# Patient Record
Sex: Female | Born: 1965 | Hispanic: No | Smoking: Never smoker
Health system: Southern US, Community
[De-identification: ages and names within clinical notes are randomized; demographics above are authoritative.]

## PROBLEM LIST (undated history)

## (undated) DIAGNOSIS — F419 Anxiety disorder, unspecified: Secondary | ICD-10-CM

## (undated) DIAGNOSIS — M1712 Unilateral primary osteoarthritis, left knee: Secondary | ICD-10-CM

## (undated) DIAGNOSIS — S83209A Unspecified tear of unspecified meniscus, current injury, unspecified knee, initial encounter: Secondary | ICD-10-CM

## (undated) DIAGNOSIS — G43909 Migraine, unspecified, not intractable, without status migrainosus: Secondary | ICD-10-CM

## (undated) HISTORY — DX: Unilateral primary osteoarthritis, left knee: M17.12

## (undated) HISTORY — DX: Unspecified tear of unspecified meniscus, current injury, unspecified knee, initial encounter: S83.209A

## (undated) HISTORY — DX: Migraine, unspecified, not intractable, without status migrainosus: G43.909

## (undated) HISTORY — DX: Anxiety disorder, unspecified: F41.9

---

## 1998-05-01 ENCOUNTER — Ambulatory Visit (HOSPITAL_COMMUNITY): Admission: RE | Admit: 1998-05-01 | Discharge: 1998-05-01 | Payer: Self-pay | Admitting: Obstetrics and Gynecology

## 1998-07-26 ENCOUNTER — Inpatient Hospital Stay (HOSPITAL_COMMUNITY): Admission: AD | Admit: 1998-07-26 | Discharge: 1998-07-28 | Payer: Self-pay | Admitting: Obstetrics and Gynecology

## 1998-08-02 ENCOUNTER — Encounter (HOSPITAL_COMMUNITY): Admission: RE | Admit: 1998-08-02 | Discharge: 1998-10-31 | Payer: Self-pay | Admitting: Obstetrics and Gynecology

## 1999-04-30 ENCOUNTER — Other Ambulatory Visit: Admission: RE | Admit: 1999-04-30 | Discharge: 1999-04-30 | Payer: Self-pay | Admitting: Obstetrics and Gynecology

## 1999-09-09 ENCOUNTER — Ambulatory Visit (HOSPITAL_COMMUNITY): Admission: RE | Admit: 1999-09-09 | Discharge: 1999-09-09 | Payer: Self-pay | Admitting: Obstetrics and Gynecology

## 1999-11-28 ENCOUNTER — Inpatient Hospital Stay (HOSPITAL_COMMUNITY): Admission: AD | Admit: 1999-11-28 | Discharge: 1999-11-30 | Payer: Self-pay | Admitting: Obstetrics and Gynecology

## 2002-05-11 ENCOUNTER — Other Ambulatory Visit: Admission: RE | Admit: 2002-05-11 | Discharge: 2002-05-11 | Payer: Self-pay | Admitting: Obstetrics and Gynecology

## 2003-10-25 ENCOUNTER — Other Ambulatory Visit: Admission: RE | Admit: 2003-10-25 | Discharge: 2003-10-25 | Payer: Self-pay | Admitting: Obstetrics and Gynecology

## 2004-02-08 ENCOUNTER — Other Ambulatory Visit: Admission: RE | Admit: 2004-02-08 | Discharge: 2004-02-08 | Payer: Self-pay | Admitting: Obstetrics and Gynecology

## 2004-12-19 ENCOUNTER — Other Ambulatory Visit: Admission: RE | Admit: 2004-12-19 | Discharge: 2004-12-19 | Payer: Self-pay | Admitting: Obstetrics and Gynecology

## 2006-03-02 HISTORY — PX: URETHRA SURGERY: SHX824

## 2009-03-02 HISTORY — PX: MYOMECTOMY: SHX85

## 2009-07-24 ENCOUNTER — Emergency Department (HOSPITAL_BASED_OUTPATIENT_CLINIC_OR_DEPARTMENT_OTHER): Admission: EM | Admit: 2009-07-24 | Discharge: 2009-07-24 | Payer: Self-pay | Admitting: Emergency Medicine

## 2009-07-24 ENCOUNTER — Ambulatory Visit: Payer: Self-pay | Admitting: Diagnostic Radiology

## 2009-09-24 ENCOUNTER — Ambulatory Visit: Payer: Self-pay | Admitting: Radiology

## 2009-09-24 ENCOUNTER — Emergency Department (HOSPITAL_BASED_OUTPATIENT_CLINIC_OR_DEPARTMENT_OTHER): Admission: EM | Admit: 2009-09-24 | Discharge: 2009-09-24 | Payer: Self-pay | Admitting: Emergency Medicine

## 2010-01-31 ENCOUNTER — Ambulatory Visit (HOSPITAL_COMMUNITY)
Admission: RE | Admit: 2010-01-31 | Discharge: 2010-01-31 | Payer: Self-pay | Source: Home / Self Care | Admitting: Obstetrics and Gynecology

## 2010-05-12 LAB — CBC
HCT: 38 % (ref 36.0–46.0)
MCH: 30.5 pg (ref 26.0–34.0)
MCHC: 35.1 g/dL (ref 30.0–36.0)
RBC: 4.36 MIL/uL (ref 3.87–5.11)
WBC: 5.2 10*3/uL (ref 4.0–10.5)

## 2010-11-07 ENCOUNTER — Encounter: Payer: Self-pay | Admitting: Emergency Medicine

## 2010-11-07 ENCOUNTER — Inpatient Hospital Stay (INDEPENDENT_AMBULATORY_CARE_PROVIDER_SITE_OTHER)
Admission: RE | Admit: 2010-11-07 | Discharge: 2010-11-07 | Disposition: A | Discharge status: Home / Self Care | Payer: BC Managed Care – PPO | Source: Ambulatory Visit | Attending: Emergency Medicine | Admitting: Emergency Medicine

## 2010-11-07 DIAGNOSIS — S61409A Unspecified open wound of unspecified hand, initial encounter: Secondary | ICD-10-CM | POA: Insufficient documentation

## 2010-11-07 DIAGNOSIS — Z23 Encounter for immunization: Secondary | ICD-10-CM

## 2010-11-09 ENCOUNTER — Inpatient Hospital Stay (INDEPENDENT_AMBULATORY_CARE_PROVIDER_SITE_OTHER)
Admission: RE | Admit: 2010-11-09 | Discharge: 2010-11-09 | Disposition: A | Discharge status: Home / Self Care | Payer: BC Managed Care – PPO | Source: Ambulatory Visit | Attending: Emergency Medicine | Admitting: Emergency Medicine

## 2010-11-09 ENCOUNTER — Encounter: Payer: Self-pay | Admitting: Emergency Medicine

## 2010-11-09 DIAGNOSIS — S61409A Unspecified open wound of unspecified hand, initial encounter: Secondary | ICD-10-CM

## 2011-02-02 NOTE — Progress Notes (Signed)
Summary: stitches hurting/TM   Vital Signs:  Patient Profile:   45 Years Old Female CC:      stitches hurt O2 treatment:    Room Air (left arm) Cuff size:   regular  Vitals Entered By: Linton Flemings RN (November 09, 2010 11:07 AM)                  Updated Prior Medication List: CELEXA 20 MG TABS (CITALOPRAM HYDROBROMIDE)   Current Allergies (reviewed today): No known allergies History of Present Illness Chief Complaint: stitches hurt History of Present Illness: She was here 2 days ago for stitches in her thumb.  She is c/o pain from the stitches.  She has not tried any Aleve or ice.  No F/C, increased redness, pus.    REVIEW OF SYSTEMS Constitutional Symptoms      Denies fever, chills, night sweats, weight loss, weight gain, and fatigue.  Eyes       Denies change in vision, eye pain, eye discharge, glasses, contact lenses, and eye surgery. Ear/Nose/Throat/Mouth       Denies hearing loss/aids, change in hearing, ear pain, ear discharge, dizziness, frequent runny nose, frequent nose bleeds, sinus problems, sore throat, hoarseness, and tooth pain or bleeding.  Respiratory       Denies dry cough, productive cough, wheezing, shortness of breath, asthma, bronchitis, and emphysema/COPD.  Cardiovascular       Denies murmurs, chest pain, and tires easily with exhertion.    Gastrointestinal       Denies stomach pain, nausea/vomiting, diarrhea, constipation, blood in bowel movements, and indigestion. Genitourniary       Denies painful urination, kidney stones, and loss of urinary control. Neurological       Denies paralysis, seizures, and fainting/blackouts. Musculoskeletal       Denies muscle pain, joint pain, joint stiffness, decreased range of motion, redness, swelling, muscle weakness, and gout.  Skin       Denies bruising, unusual mles/lumps or sores, and hair/skin or nail changes.  Psych       Denies mood changes, temper/anger issues, anxiety/stress, speech problems,  depression, and sleep problems.  Past History:  Past Medical History: Reviewed history from 11/07/2010 and no changes required. Unremarkable  Past Surgical History: Reviewed history from 11/07/2010 and no changes required. Denies surgical history  Family History: Reviewed history and no changes required.  Social History: Reviewed history and no changes required.  Plan New Orders: No Charge Patient Arrived (NCPA0) [NCPA0] Planning Comments:   She is evaluated by the nurse who believes it doesn't look infected and it is still a clean wound.  It is rewrapped and she is advised to use dry cool compresses, Aleve as needed.  It will likely be sore for weeks, but if worsening, needs to f/u with her PCP. *Of note, she is being very dramatic and won't let the nurse take her BP because the cuff "hurts too much".  This is c/w when I saw the patient 2 days ago.  ER precautions given for severe pain or difficulty moving thumb.   The patient and/or caregiver has been counseled thoroughly with regard to medications prescribed including dosage, schedule, interactions, rationale for use, and possible side effects and they verbalize understanding.  Diagnoses and expected course of recovery discussed and will return if not improved as expected or if the condition worsens. Patient and/or caregiver verbalized understanding.   PROCEDURE: No redness. No edema. No drainage from incision.  Orders Added: 1)  No Charge Patient  Arrived (NCPA0) [NCPA0]

## 2011-02-02 NOTE — Progress Notes (Signed)
Summary: LAC TO THUMB...WSE   Vital Signs:  Patient Profile:   45 Years Old Female CC:      Lac to thumb Height:     67 inches Weight:      245 pounds O2 Sat:      99 % O2 treatment:    Room Air Temp:     98.4 degrees F oral Pulse rate:   86 / minute Pulse rhythm:   regular Resp:     18 per minute BP sitting:   132 / 84  (left arm) Cuff size:   large  Vitals Entered By: Emilio Math (November 07, 2010 8:14 PM)                  Current Allergies: No known allergies History of Present Illness Chief Complaint: Lac to thumb History of Present Illness: R handed cut her L thumb on a barbed wire fence today.  It bled a lot initially but stopped with pressure.  She has tenderness currently.  Last Td about 5 years ago.  No F/C.  No problems using her hand or fingers.  REVIEW OF SYSTEMS Constitutional Symptoms      Denies fever, chills, night sweats, weight loss, weight gain, and fatigue.  Eyes       Denies change in vision, eye pain, eye discharge, glasses, contact lenses, and eye surgery. Ear/Nose/Throat/Mouth       Denies hearing loss/aids, change in hearing, ear pain, ear discharge, dizziness, frequent runny nose, frequent nose bleeds, sinus problems, sore throat, hoarseness, and tooth pain or bleeding.  Respiratory       Denies dry cough, productive cough, wheezing, shortness of breath, asthma, bronchitis, and emphysema/COPD.  Cardiovascular       Denies murmurs, chest pain, and tires easily with exhertion.    Gastrointestinal       Denies stomach pain, nausea/vomiting, diarrhea, constipation, blood in bowel movements, and indigestion. Genitourniary       Denies painful urination, kidney stones, and loss of urinary control. Neurological       Denies paralysis, seizures, and fainting/blackouts. Musculoskeletal       Denies muscle pain, joint pain, joint stiffness, decreased range of motion, redness, swelling, muscle weakness, and gout.  Skin       Denies bruising,  unusual mles/lumps or sores, and hair/skin or nail changes.      Comments: Lac to thumb Psych       Denies mood changes, temper/anger issues, anxiety/stress, speech problems, depression, and sleep problems.  Past History:  Past Medical History: Unremarkable  Past Surgical History: Denies surgical history Physical Exam General appearance: well developed, well nourished, no acute distress MSE: oriented to time, place, and person L hand: there is a 1.5cm superficial laceration, minimal oozing of blood. She has TTP.  It is located radial aspect along 1st metacarpal.  Distal NV status intact.  FROM IP, MCP, [facility].   Assessment New Problems: WOUND, OPEN, HAND, WITHOUT COMPLICATIONS (ICD-882.0)   Patient Education: Patient and/or caregiver instructed in the following: rest, fluids, Tylenol prn, Ibuprofen prn.  Plan New Orders: Tdap => 72yrs IM [90715] Admin 1st Vaccine [90471] New Patient Level III [99203] Repair Superficial Wound(s) 2.5cm or <(scalp,neck,axillae,ext gent,trunk,extre) [12001] Planning Comments:   Wound repaired as follows.  Wound precautions given.  No antibiotics given today since appears very clean, but warned pt and told to call if worsening at all.  Td given today since was a potentially rusty fence.  Keep clean and dry.  RTC 9-10 days for removal.   The patient and/or caregiver has been counseled thoroughly with regard to medications prescribed including dosage, schedule, interactions, rationale for use, and possible side effects and they verbalize understanding.  Diagnoses and expected course of recovery discussed and will return if not improved as expected or if the condition worsens. Patient and/or caregiver verbalized understanding.   PROCEDURE:  Suture Site: L hand radial aspect of R thumb metacarpal Size: 1.5cm Number of Lacerations: 1 Procedure: Risks, benefits and alternatives discussed with patient.  They voice understanding. Discussed benefits and risks  of procedure and verbal consent obtained.  Using sterile technique and local 1% lidocaine without epinephrine, cleansed wound with hibaclens followed by copious lavage with normal saline.  Wound carefully inspected for debris and foreign bodies; none found.  Wound closed with #2 , 4-0 interrupted nylon sutures.  Bacitracin and non-stick sterile dressing applied.  Wound precautions explained to patient.   Orders Added: 1)  Tdap => 51yrs IM [90715] 2)  Admin 1st Vaccine [90471] 3)  New Patient Level III [16109] 4)  Repair Superficial Wound(s) 2.5cm or <(scalp,neck,axillae,ext gent,trunk,extre) [12001]   Immunizations Administered:  Tetanus Vaccine:    Vaccine Type: Tdap    Site: left deltoid    Mfr: Boostrix    Dose: 0.5 ml    Route: IM    Given by: Emilio Math    Exp. Date: 01/24/2012    Lot #: [id number]    VIS given: 01/18/08 version given November 07, 2010.   Immunizations Administered:  Tetanus Vaccine:    Vaccine Type: Tdap    Site: left deltoid    Mfr: Boostrix    Dose: 0.5 ml    Route: IM    Given by: Emilio Math    Exp. Date: 01/24/2012    Lot #: [id number]    VIS given: 01/18/08 version given November 07, 2010.   Orders Added: 1)  Tdap => 4yrs IM [90715] 2)  Admin 1st Vaccine [90471] 3)  New Patient Level III [13086] 4)  Repair Superficial Wound(s) 2.5cm or <(scalp,neck,axillae,ext gent,trunk,extre) [12001]

## 2011-04-03 DIAGNOSIS — S83209A Unspecified tear of unspecified meniscus, current injury, unspecified knee, initial encounter: Secondary | ICD-10-CM

## 2011-04-03 HISTORY — DX: Unspecified tear of unspecified meniscus, current injury, unspecified knee, initial encounter: S83.209A

## 2011-07-21 IMAGING — CR DG SHOULDER 2+V*L*
3 series · 3 of 3 positions shown · non-contrast
Comparison: None.

CLINICAL DATA: Fell - left shoulder pain

LEFT SHOULDER - 2+ VIEW

[w shoulder ap internal left]
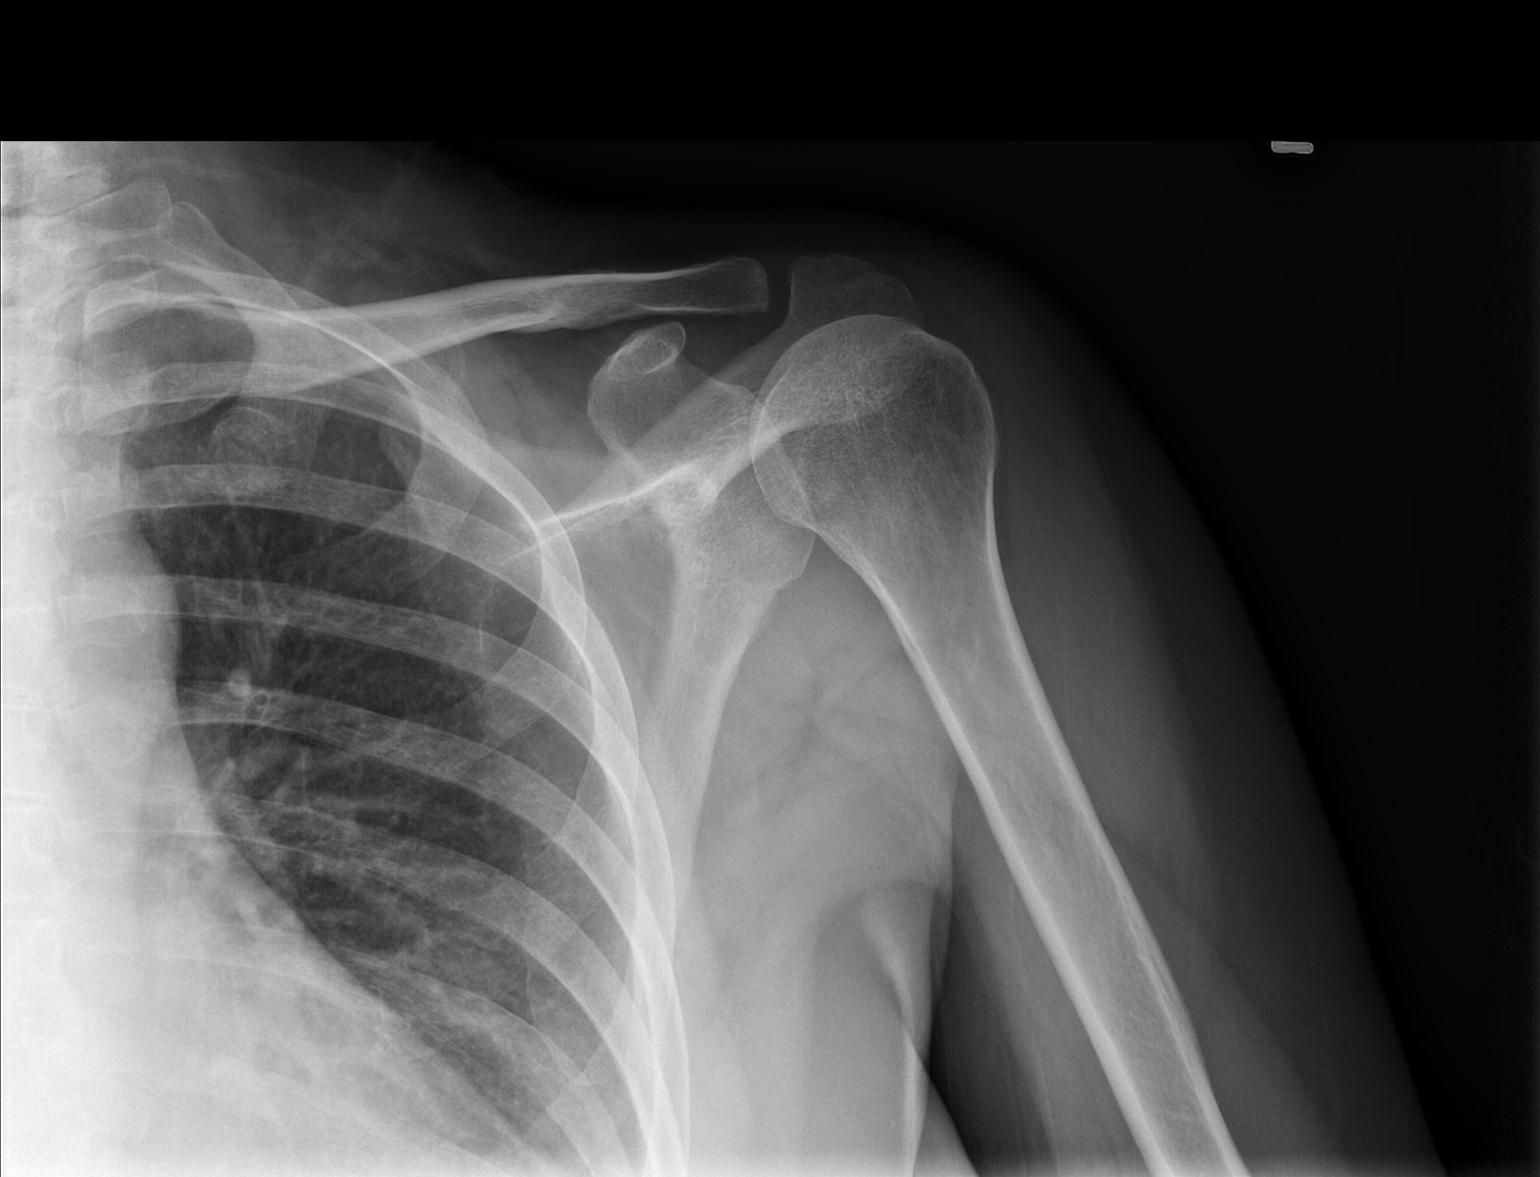

[w shoulder ap external left]
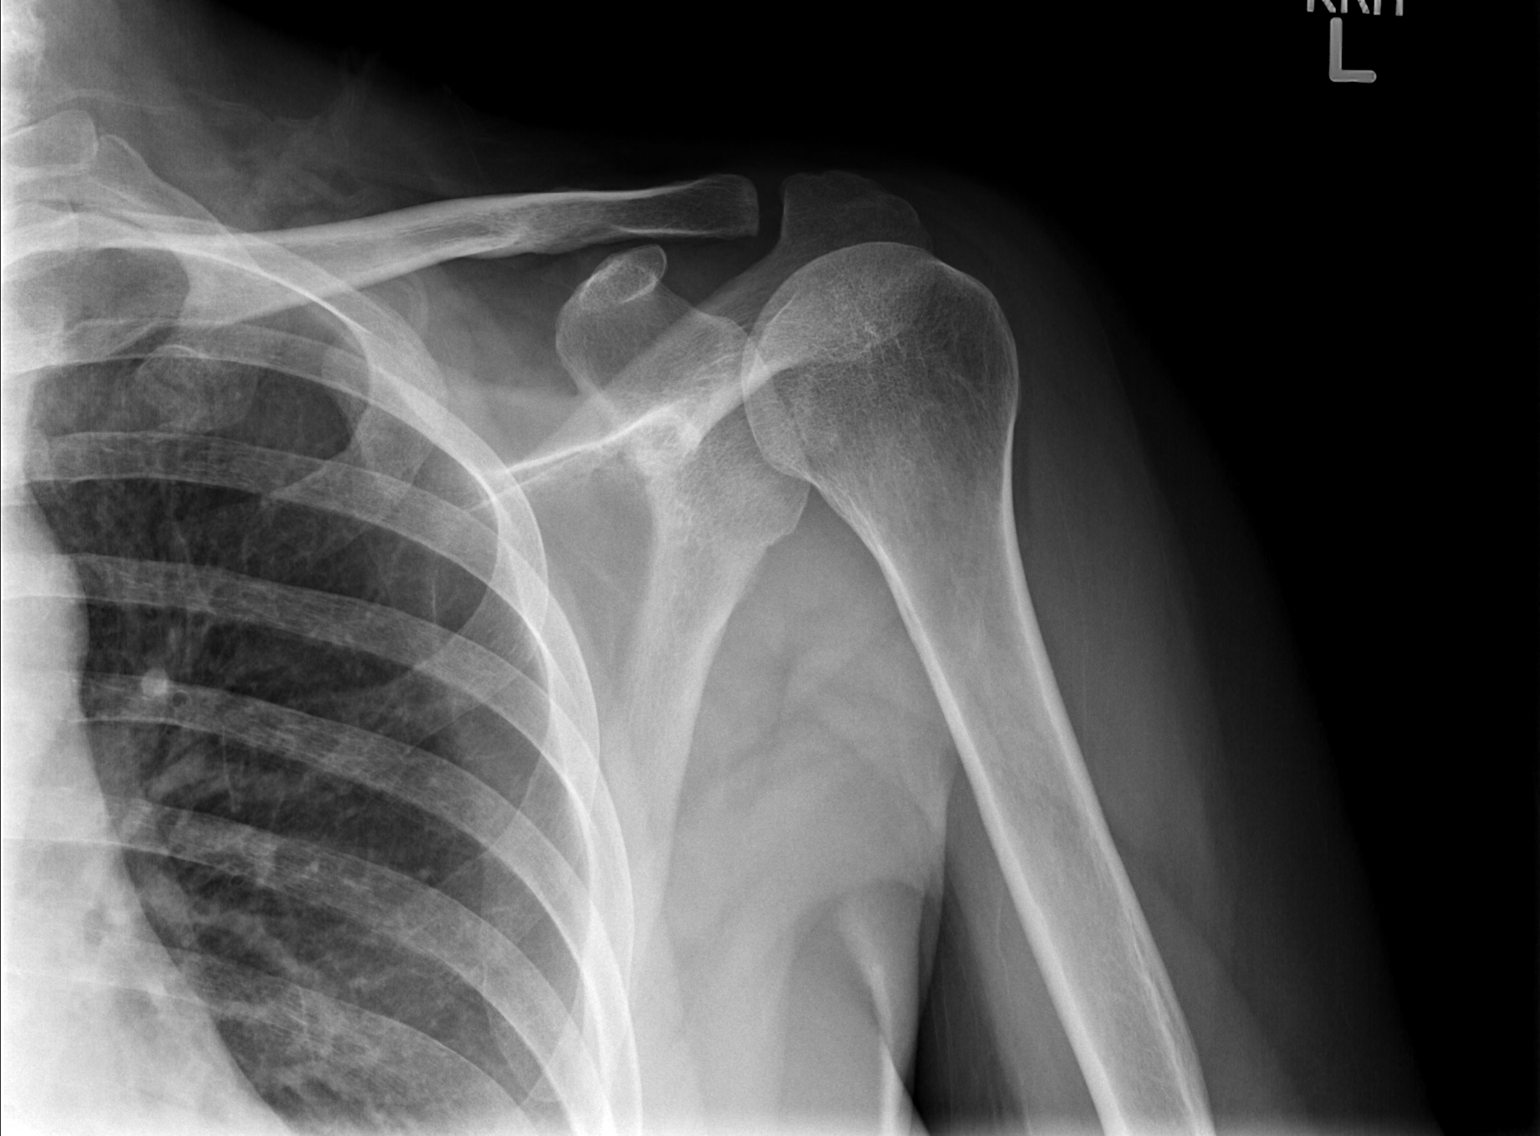

[w shoulder y view left]
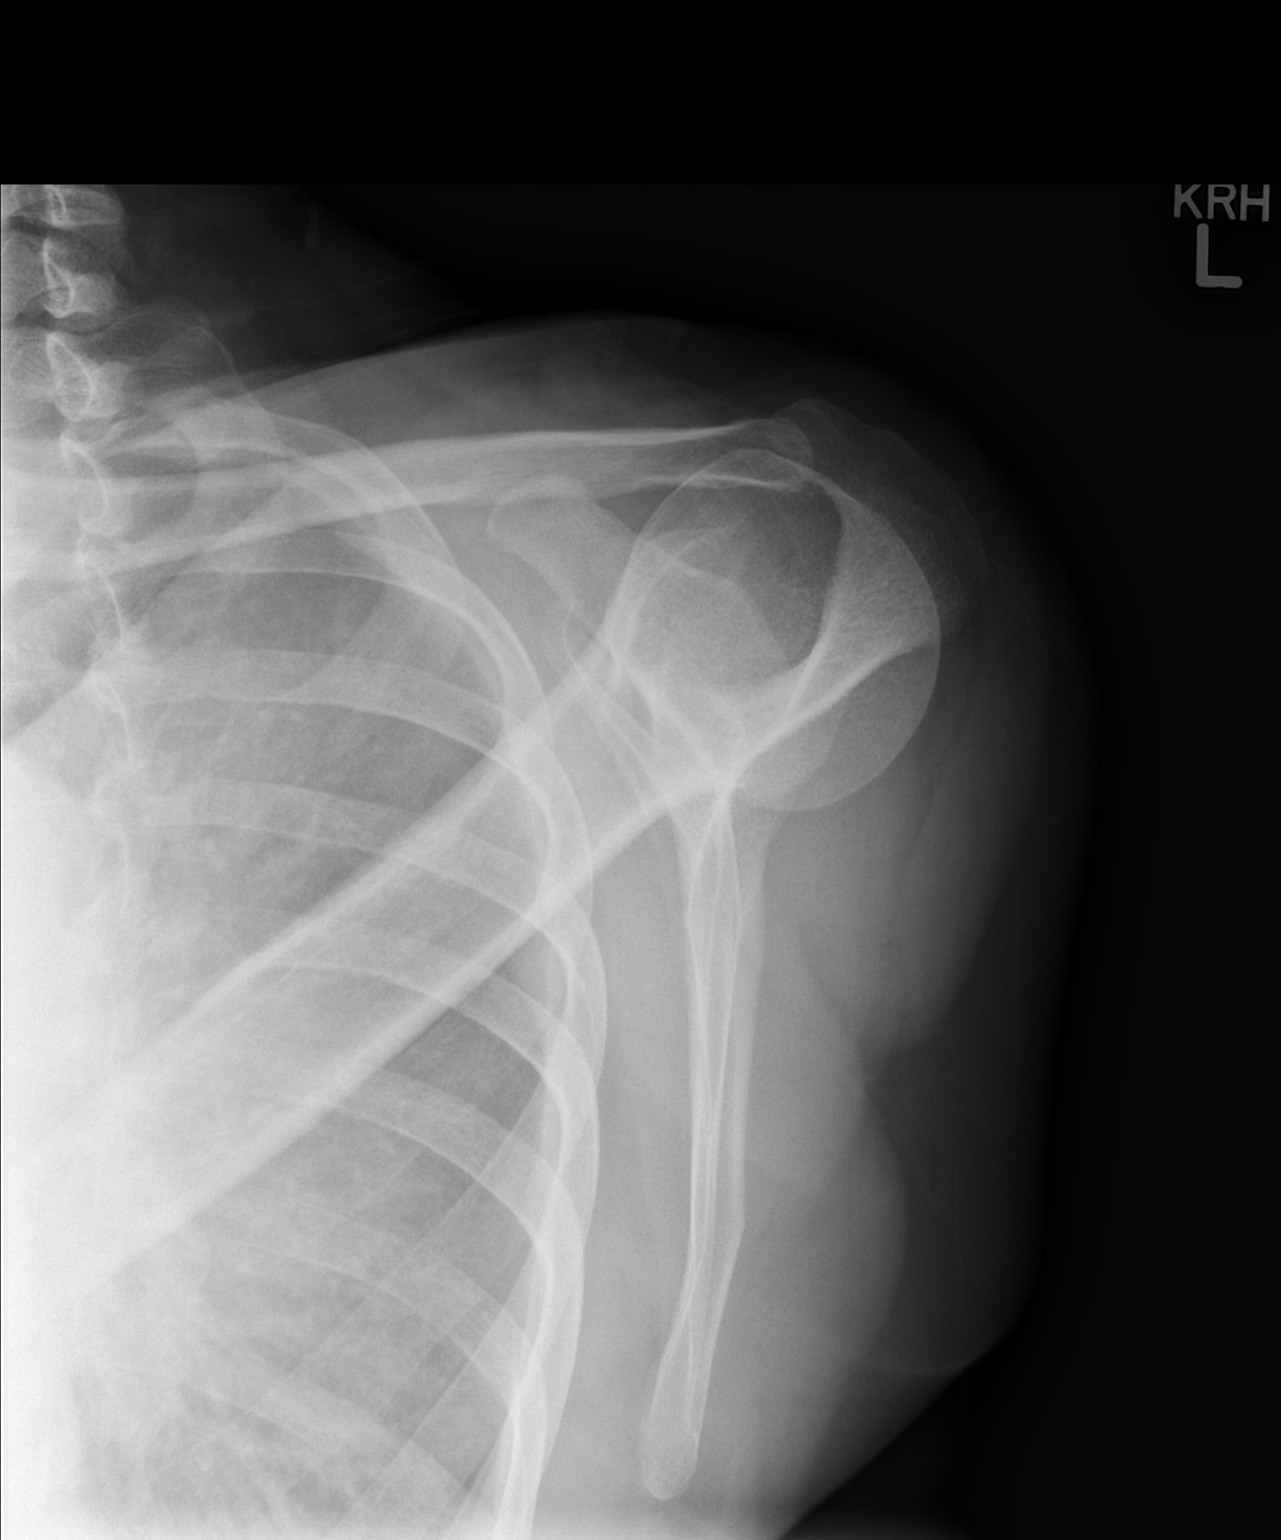

[3 of 3 positions shown; findings below may reference images not displayed]

FINDINGS: No fracture or dislocation.  No foreign body or other
abnormality of the soft tissues.
IMPRESSION: No acute or significant findings.

## 2011-08-13 HISTORY — PX: MENISCUS REPAIR: SHX5179

## 2011-08-31 ENCOUNTER — Encounter (HOSPITAL_COMMUNITY): Payer: Self-pay | Admitting: Psychiatry

## 2011-08-31 ENCOUNTER — Ambulatory Visit (INDEPENDENT_AMBULATORY_CARE_PROVIDER_SITE_OTHER): Payer: BC Managed Care – PPO | Admitting: Psychiatry

## 2011-08-31 VITALS — BP 130/86 | HR 92 | Ht 67.0 in | Wt 252.0 lb

## 2011-08-31 DIAGNOSIS — F419 Anxiety disorder, unspecified: Secondary | ICD-10-CM

## 2011-08-31 DIAGNOSIS — F411 Generalized anxiety disorder: Secondary | ICD-10-CM

## 2011-08-31 DIAGNOSIS — F329 Major depressive disorder, single episode, unspecified: Secondary | ICD-10-CM

## 2011-08-31 MED ORDER — CITALOPRAM HYDROBROMIDE 20 MG PO TABS: 20.0000 mg | ORAL_TABLET | Freq: Once | ORAL | Status: DC

## 2011-08-31 MED ORDER — VENLAFAXINE HCL ER 37.5 MG PO CP24: 37.5000 mg | ORAL_CAPSULE | Freq: Every day | ORAL | Status: DC
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
## 2011-09-19 DIAGNOSIS — F419 Anxiety disorder, unspecified: Secondary | ICD-10-CM | POA: Insufficient documentation

## 2011-09-19 DIAGNOSIS — F330 Major depressive disorder, recurrent, mild: Secondary | ICD-10-CM | POA: Insufficient documentation

## 2011-09-28 ENCOUNTER — Ambulatory Visit (INDEPENDENT_AMBULATORY_CARE_PROVIDER_SITE_OTHER): Payer: BC Managed Care – PPO | Admitting: Psychiatry

## 2011-09-28 ENCOUNTER — Encounter (HOSPITAL_COMMUNITY): Payer: Self-pay | Admitting: Psychiatry

## 2011-09-28 VITALS — BP 124/85 | HR 100 | Ht 67.0 in | Wt 254.0 lb

## 2011-09-28 DIAGNOSIS — F332 Major depressive disorder, recurrent severe without psychotic features: Secondary | ICD-10-CM

## 2011-09-28 DIAGNOSIS — F411 Generalized anxiety disorder: Secondary | ICD-10-CM

## 2011-09-28 MED ORDER — VENLAFAXINE HCL ER 150 MG PO CP24: 150.0000 mg | ORAL_CAPSULE | Freq: Every day | ORAL | Status: DC
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
## 2011-10-14 ENCOUNTER — Ambulatory Visit (HOSPITAL_COMMUNITY): Payer: Self-pay | Admitting: Psychiatry

## 2011-11-06 ENCOUNTER — Ambulatory Visit (HOSPITAL_COMMUNITY): Payer: Self-pay | Admitting: Psychiatry

## 2011-11-11 ENCOUNTER — Encounter (HOSPITAL_COMMUNITY): Payer: Self-pay | Admitting: Psychiatry

## 2011-11-11 ENCOUNTER — Ambulatory Visit (INDEPENDENT_AMBULATORY_CARE_PROVIDER_SITE_OTHER): Payer: BC Managed Care – PPO | Admitting: Psychiatry

## 2011-11-11 VITALS — BP 132/94 | Ht 67.0 in | Wt 254.0 lb

## 2011-11-11 DIAGNOSIS — F411 Generalized anxiety disorder: Secondary | ICD-10-CM

## 2011-11-11 DIAGNOSIS — F329 Major depressive disorder, single episode, unspecified: Secondary | ICD-10-CM

## 2011-11-11 DIAGNOSIS — F419 Anxiety disorder, unspecified: Secondary | ICD-10-CM

## 2011-11-11 DIAGNOSIS — F332 Major depressive disorder, recurrent severe without psychotic features: Secondary | ICD-10-CM
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
## 2011-11-13 ENCOUNTER — Encounter (HOSPITAL_COMMUNITY): Payer: Self-pay | Admitting: Psychiatry

## 2011-11-17 ENCOUNTER — Telehealth (HOSPITAL_COMMUNITY): Payer: Self-pay

## 2011-11-17 DIAGNOSIS — F411 Generalized anxiety disorder: Secondary | ICD-10-CM

## 2011-11-17 MED ORDER — VENLAFAXINE HCL ER 37.5 MG PO CP24
ORAL_CAPSULE | ORAL | Status: DC
Start: 2011-11-17 — End: 2011-12-14

## 2011-11-17 NOTE — Telephone Encounter (Signed)
Will attempt to titrate the patient off Effexor by 37.5 mg daily. If there is a return of depressive symptoms or anxiety, will initiate a trial of Zoloft as Zoloft was started in 2008 for anxiety while the patient was in a stressful marriage.   PLAN: Will start titrating dosage of Effexor 37.5 mg-Take 3 capsules for 7 days, then 2 capsules for 7 days, then 1 capsule for 7 days, then 1 capsule every other day for 7 days, then stop. I have advised the patient to call the clinic every Monday, prior to decreasing dosage, or if she has a return of depressive symptoms or anxiety at which point we will initiate sertraline and possibly bupropion for augmentation.

## 2011-11-17 NOTE — Telephone Encounter (Signed)
Pt is still having high blood pressure. Went to pcp and pcp is concerned that effexor is causing it. Would like to see if you could wean of effexor and try something else.

## 2011-11-17 NOTE — Telephone Encounter (Signed)
Patient called. She reports that she went to her PCP who believes effexor may be the cause of hypertension.  As patient has done well on Zoloft and Wellbutrin in the past, will taper effexor and start a titrating dose of zoloft, and later add wellbutrin if needed.

## 2011-11-17 NOTE — Addendum Note (Signed)
Addended by: Larena Sox on: 11/17/2011 04:42 PM   Modules accepted: Orders

## 2011-11-20 ENCOUNTER — Other Ambulatory Visit (HOSPITAL_COMMUNITY): Payer: Self-pay | Admitting: Psychiatry

## 2011-12-14 ENCOUNTER — Ambulatory Visit (INDEPENDENT_AMBULATORY_CARE_PROVIDER_SITE_OTHER): Payer: BC Managed Care – PPO | Admitting: Psychiatry

## 2011-12-14 ENCOUNTER — Encounter (HOSPITAL_COMMUNITY): Payer: Self-pay | Admitting: Psychiatry

## 2011-12-14 VITALS — BP 125/96 | HR 103 | Ht 67.0 in | Wt 261.0 lb

## 2011-12-14 DIAGNOSIS — F332 Major depressive disorder, recurrent severe without psychotic features: Secondary | ICD-10-CM

## 2011-12-14 DIAGNOSIS — F411 Generalized anxiety disorder: Secondary | ICD-10-CM

## 2011-12-14 DIAGNOSIS — F419 Anxiety disorder, unspecified: Secondary | ICD-10-CM

## 2011-12-14 MED ORDER — BUSPIRONE HCL 5 MG PO TABS: 5.0000 mg | ORAL_TABLET | Freq: Three times a day (TID) | ORAL | Status: DC
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
## 2012-01-05 ENCOUNTER — Ambulatory Visit (INDEPENDENT_AMBULATORY_CARE_PROVIDER_SITE_OTHER): Payer: BC Managed Care – PPO | Admitting: Psychiatry

## 2012-01-05 ENCOUNTER — Encounter (HOSPITAL_COMMUNITY): Payer: Self-pay | Admitting: Psychiatry

## 2012-01-05 VITALS — BP 132/95 | HR 88 | Ht 67.0 in | Wt 262.0 lb

## 2012-01-05 DIAGNOSIS — F332 Major depressive disorder, recurrent severe without psychotic features: Secondary | ICD-10-CM

## 2012-01-05 DIAGNOSIS — F329 Major depressive disorder, single episode, unspecified: Secondary | ICD-10-CM

## 2012-01-05 DIAGNOSIS — F411 Generalized anxiety disorder: Secondary | ICD-10-CM
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
## 2012-01-11 ENCOUNTER — Other Ambulatory Visit (HOSPITAL_COMMUNITY): Payer: Self-pay | Admitting: Psychiatry

## 2012-01-14 ENCOUNTER — Emergency Department
Admission: EM | Admit: 2012-01-14 | Discharge: 2012-01-31 | Disposition: E | Payer: BC Managed Care – PPO | Source: Home / Self Care

## 2012-01-14 ENCOUNTER — Telehealth (HOSPITAL_COMMUNITY): Payer: Self-pay | Admitting: Psychiatry

## 2012-01-14 NOTE — Telephone Encounter (Signed)
The patient reports she is doing well on buspirone 5 mg BID.  PLAN: Advised patient to continue this dosage.

## 2012-01-31 DEATH — deceased

## 2012-02-10 ENCOUNTER — Encounter (HOSPITAL_COMMUNITY): Payer: Self-pay | Admitting: Psychiatry

## 2012-02-10 ENCOUNTER — Ambulatory Visit (INDEPENDENT_AMBULATORY_CARE_PROVIDER_SITE_OTHER): Payer: BC Managed Care – PPO | Admitting: Psychiatry

## 2012-02-10 VITALS — BP 136/87 | HR 89 | Ht 67.0 in | Wt 260.0 lb

## 2012-02-10 DIAGNOSIS — F411 Generalized anxiety disorder: Secondary | ICD-10-CM

## 2012-02-10 DIAGNOSIS — F330 Major depressive disorder, recurrent, mild: Secondary | ICD-10-CM

## 2012-02-10 DIAGNOSIS — F419 Anxiety disorder, unspecified: Secondary | ICD-10-CM

## 2012-02-10 MED ORDER — BUSPIRONE HCL 5 MG PO TABS: 5.0000 mg | ORAL_TABLET | Freq: Three times a day (TID) | ORAL | Status: DC
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
## 2012-04-12 ENCOUNTER — Ambulatory Visit (HOSPITAL_COMMUNITY): Payer: Self-pay | Admitting: Psychiatry

## 2012-04-22 ENCOUNTER — Ambulatory Visit (INDEPENDENT_AMBULATORY_CARE_PROVIDER_SITE_OTHER): Payer: BC Managed Care – PPO | Admitting: Psychiatry

## 2012-04-22 ENCOUNTER — Encounter (HOSPITAL_COMMUNITY): Payer: Self-pay | Admitting: Psychiatry

## 2012-04-22 VITALS — BP 119/82 | HR 90 | Ht 67.0 in | Wt 253.0 lb

## 2012-04-22 DIAGNOSIS — F419 Anxiety disorder, unspecified: Secondary | ICD-10-CM

## 2012-04-22 DIAGNOSIS — F330 Major depressive disorder, recurrent, mild: Secondary | ICD-10-CM

## 2012-04-22 DIAGNOSIS — F411 Generalized anxiety disorder: Secondary | ICD-10-CM

## 2012-04-22 MED ORDER — BUSPIRONE HCL 5 MG PO TABS: 5.0000 mg | ORAL_TABLET | Freq: Two times a day (BID) | ORAL | Status: DC
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
## 2012-06-03 ENCOUNTER — Ambulatory Visit (INDEPENDENT_AMBULATORY_CARE_PROVIDER_SITE_OTHER): Payer: BC Managed Care – PPO | Admitting: Psychiatry

## 2012-06-03 ENCOUNTER — Encounter (HOSPITAL_COMMUNITY): Payer: Self-pay | Admitting: Psychiatry

## 2012-06-03 VITALS — BP 123/90 | HR 76 | Ht 67.0 in | Wt 248.0 lb

## 2012-06-03 DIAGNOSIS — F330 Major depressive disorder, recurrent, mild: Secondary | ICD-10-CM

## 2012-06-03 DIAGNOSIS — F419 Anxiety disorder, unspecified: Secondary | ICD-10-CM

## 2012-06-03 DIAGNOSIS — F411 Generalized anxiety disorder: Secondary | ICD-10-CM

## 2012-06-03 MED ORDER — BUSPIRONE HCL 10 MG PO TABS: 10.0000 mg | ORAL_TABLET | Freq: Three times a day (TID) | ORAL | Status: DC

## 2012-06-03 MED ORDER — SERTRALINE HCL 50 MG PO TABS: 50.0000 mg | ORAL_TABLET | Freq: Every day | ORAL | Status: DC

## 2012-06-03 MED ORDER — TRAZODONE HCL 50 MG PO TABS: 50.0000 mg | ORAL_TABLET | Freq: Every day | ORAL | Status: DC
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
## 2012-06-21 ENCOUNTER — Ambulatory Visit (HOSPITAL_COMMUNITY): Payer: Self-pay | Admitting: Psychiatry

## 2012-07-04 ENCOUNTER — Other Ambulatory Visit (HOSPITAL_COMMUNITY): Payer: Self-pay

## 2012-07-07 ENCOUNTER — Encounter: Payer: Self-pay | Admitting: Physician Assistant

## 2012-07-07 ENCOUNTER — Other Ambulatory Visit: Payer: Self-pay | Admitting: Physician Assistant

## 2012-07-07 DIAGNOSIS — G43909 Migraine, unspecified, not intractable, without status migrainosus: Secondary | ICD-10-CM | POA: Insufficient documentation

## 2012-07-07 DIAGNOSIS — M1712 Unilateral primary osteoarthritis, left knee: Secondary | ICD-10-CM | POA: Insufficient documentation

## 2012-07-07 NOTE — H&P (Signed)
TOTAL KNEE ADMISSION H&P  Patient is being admitted for left total knee arthroplasty.  Subjective:  Chief Complaint:left knee pain.  HPI: Melissa Blake, 47 y.o. female, has a history of pain and functional disability in the left knee due to trauma and arthritis and has failed non-surgical conservative treatments for greater than 12 weeks to includeNSAID's and/or analgesics, corticosteriod injections, flexibility and strengthening excercises, supervised PT with diminished ADL's post treatment, use of assistive devices, weight reduction as appropriate and activity modification.  Onset of symptoms was gradual, starting 4 years ago with rapidlly worsening course since that time. The patient noted prior procedures on the knee to include  arthroscopy and menisectomy on the left knee(s).  Patient currently rates pain in the left knee(s) at 10 out of 10 with activity. Patient has night pain, worsening of pain with activity and weight bearing, pain that interferes with activities of daily living, crepitus and joint swelling.  Patient has evidence of subchondral sclerosis, periarticular osteophytes and joint space narrowing by imaging studies.  There is no active infection.  Patient Active Problem List   Diagnosis Date Noted  . MDD (major depressive disorder), recurrent episode, mild 09/19/2011  . Anxiety disorder 09/19/2011  . WOUND, OPEN, HAND, WITHOUT COMPLICATIONS 11/07/2010   Past Medical History  Diagnosis Date  . Meniscus tear 04/2011  . Migraines   . Degenerative arthritis of left knee   . Anxiety     Past Surgical History  Procedure Laterality Date  . Meniscus repair  08-13-11  . Urethra surgery  2008  . Myomectomy  2011     (Not in a hospital admission) Allergies  Allergen Reactions  . Diflucan (Fluconazole) Hives    History  Substance Use Topics  . Smoking status: Never Smoker   . Smokeless tobacco: Not on file  . Alcohol Use: 1.0 oz/week    2 drink(s) per week     Comment:  1-2 beers a month    Family History  Problem Relation Age of Onset  . Hypertension Father   . Asthma Father   . Nephrolithiasis Father   . Depression Maternal Aunt   . Diabetes type II Maternal Grandmother      Review of Systems  Constitutional: Negative.   HENT: Negative.   Eyes: Negative.   Respiratory: Negative.   Cardiovascular: Negative.   Gastrointestinal: Negative.   Genitourinary: Negative.   Musculoskeletal: Positive for joint pain.       Left knee  Skin: Positive for rash.  Neurological: Negative.   Psychiatric/Behavioral: Negative.     Objective:  Physical Exam  Constitutional: She is oriented to person, place, and time. She appears well-developed and well-nourished.  HENT:  Head: Normocephalic and atraumatic.  Eyes: Conjunctivae and EOM are normal. Pupils are equal, round, and reactive to light.  Neck: Normal range of motion.  Cardiovascular: Normal rate and regular rhythm.   Respiratory: Effort normal.  GI: Soft.  Genitourinary:  Not pertinent to current symptomatology therefore not examined.  Musculoskeletal: She exhibits tenderness.   Examination of her left knee reveals pain medially and laterally 1+ crepitation 1+ synovitis range of motion 0-120 degrees moderate varus deformity knee is stable to ligamentous exam with normal patella tracking. Exam of her right knee reveals full range of motion without pain swelling weakness or instability. Vascular exam: pulses 2+ and symmetric.  Neurological: She is alert and oriented to person, place, and time.  Skin: Skin is warm and dry.  Psychiatric: She has a normal mood and   affect. Her behavior is normal. Thought content normal.    Vital signs in last 24 hours: Last recorded: 05/08 1500   BP: 122/86 Pulse: 79  Temp: 98.1 F (36.7 C)    Height: 5' 7" (1.702 m) SpO2: 98  Weight: 109.77 kg (242 lb)     Labs:   Estimated body mass index is 37.89 kg/(m^2) as calculated from the following:   Height as of  this encounter: 5' 7" (1.702 m).   Weight as of this encounter: 109.77 kg (242 lb).   Imaging Review Plain radiographs demonstrate severe degenerative joint disease of the left knee(s). The overall alignment issignificant varus. The bone quality appears to be good for age and reported activity level.  Assessment/Plan:  End stage arthritis, left knee   The patient history, physical examination, clinical judgment of the provider and imaging studies are consistent with end stage degenerative joint disease of the left knee(s) and total knee arthroplasty is deemed medically necessary. The treatment options including medical management, injection therapy arthroscopy and arthroplasty were discussed at length. The risks and benefits of total knee arthroplasty were presented and reviewed. The risks due to aseptic loosening, infection, stiffness, patella tracking problems, thromboembolic complications and other imponderables were discussed. The patient acknowledged the explanation, agreed to proceed with the plan and consent was signed. Patient is being admitted for inpatient treatment for surgery, pain control, PT, OT, prophylactic antibiotics, VTE prophylaxis, progressive ambulation and ADL's and discharge planning. The patient is planning to be discharged to skilled nursing facility vs going home  Lawrie Tunks A. Yue Flanigan, PA-C Physician Assistant [facility] [phone]  07/07/2012, 5:05 PM   

## 2012-07-09 NOTE — Pre-Procedure Instructions (Signed)
Melissa Blake  07/09/2012   Your procedure is scheduled on:  May 19  Report to [facility] at 07:00 AM.  Call this number if you have problems the morning of surgery: [phone]   Remember:   Do not eat food or drink liquids after midnight.   Take these medicines the morning of surgery with A SIP OF WATER: Topamax, Zoloft, Buspar, Hydrocodone (if needed),      STOP Nabumetone (Relafen) Today  Do not wear jewelry, make-up or nail polish.  Do not wear lotions, powders, or perfumes. You may wear deodorant.  Do not shave 48 hours prior to surgery. Men may shave face and neck.  Do not bring valuables to the hospital.  Contacts, dentures or bridgework may not be worn into surgery.  Leave suitcase in the car. After surgery it may be brought to your room.  For patients admitted to the hospital, checkout time is 11:00 AM the day of discharge.   Special Instructions: Shower using CHG 2 nights before surgery and the night before surgery.  If you shower the day of surgery use CHG.  Use special wash - you have one bottle of CHG for all showers.  You should use approximately 1/3 of the bottle for each shower.   Please read over the following fact sheets that you were given: Pain Booklet, Coughing and Deep Breathing, Blood Transfusion Information, MRSA Information and Surgical Site Infection Prevention

## 2012-07-11 ENCOUNTER — Encounter (HOSPITAL_COMMUNITY): Payer: Self-pay

## 2012-07-11 ENCOUNTER — Encounter (HOSPITAL_COMMUNITY)
Admission: RE | Admit: 2012-07-11 | Discharge: 2012-07-11 | Disposition: A | Discharge status: Home / Self Care | Payer: BC Managed Care – PPO | Source: Ambulatory Visit | Attending: Physician Assistant | Admitting: Physician Assistant

## 2012-07-11 ENCOUNTER — Encounter (HOSPITAL_COMMUNITY)
Admission: RE | Admit: 2012-07-11 | Discharge: 2012-07-11 | Disposition: A | Discharge status: Home / Self Care | Payer: BC Managed Care – PPO | Source: Ambulatory Visit | Attending: Orthopedic Surgery | Admitting: Orthopedic Surgery

## 2012-07-11 DIAGNOSIS — Z01810 Encounter for preprocedural cardiovascular examination: Secondary | ICD-10-CM | POA: Insufficient documentation

## 2012-07-11 DIAGNOSIS — Z01818 Encounter for other preprocedural examination: Secondary | ICD-10-CM | POA: Insufficient documentation

## 2012-07-11 DIAGNOSIS — Z01812 Encounter for preprocedural laboratory examination: Secondary | ICD-10-CM | POA: Insufficient documentation

## 2012-07-11 LAB — CBC WITH DIFFERENTIAL/PLATELET
Basophils Absolute: 0 10*3/uL (ref 0.0–0.1)
HCT: 38.6 % (ref 36.0–46.0)
Hemoglobin: 13.9 g/dL (ref 12.0–15.0)
Lymphocytes Relative: 25 % (ref 12–46)
Monocytes Absolute: 0.3 10*3/uL (ref 0.1–1.0)
Monocytes Relative: 5 % (ref 3–12)
Neutro Abs: 4.1 10*3/uL (ref 1.7–7.7)
Neutrophils Relative %: 68 % (ref 43–77)
WBC: 6.1 10*3/uL (ref 4.0–10.5)

## 2012-07-11 LAB — SURGICAL PCR SCREEN: Staphylococcus aureus: NEGATIVE

## 2012-07-11 LAB — URINALYSIS, ROUTINE W REFLEX MICROSCOPIC
Glucose, UA: NEGATIVE mg/dL
Ketones, ur: NEGATIVE mg/dL
Nitrite: NEGATIVE
Specific Gravity, Urine: 1.024 (ref 1.005–1.030)
pH: 7 (ref 5.0–8.0)

## 2012-07-11 LAB — COMPREHENSIVE METABOLIC PANEL
AST: 24 U/L (ref 0–37)
Albumin: 3.5 g/dL (ref 3.5–5.2)
Alkaline Phosphatase: 144 U/L — ABNORMAL HIGH (ref 39–117)
CO2: 22 mEq/L (ref 19–32)
Chloride: 105 mEq/L (ref 96–112)
Creatinine, Ser: 0.75 mg/dL (ref 0.50–1.10)
GFR calc non Af Amer: >90 mL/min (ref >90)
Potassium: 3.7 mEq/L (ref 3.5–5.1)
Total Bilirubin: 0.3 mg/dL (ref 0.3–1.2)

## 2012-07-11 LAB — PROTIME-INR: INR: 0.99 (ref 0.00–1.49)

## 2012-07-11 LAB — HCG, SERUM, QUALITATIVE: Preg, Serum: NEGATIVE

## 2012-07-11 LAB — TYPE AND SCREEN

## 2012-07-11 LAB — URINE MICROSCOPIC-ADD ON

## 2012-07-11 LAB — APTT: aPTT: 30 seconds (ref 24–37)

## 2012-07-11 LAB — ABO/RH: ABO/RH(D): O NEG

## 2012-07-12 LAB — URINE CULTURE: Colony Count: 60000

## 2012-07-17 MED ORDER — SODIUM CHLORIDE 0.9 % IV SOLN
1000.0000 mg | INTRAVENOUS | Status: AC
  Administered 2012-07-18: 1000 mg via INTRAVENOUS
  Filled 2012-07-17 (×2): qty 10

## 2012-07-17 MED ORDER — CEFAZOLIN SODIUM-DEXTROSE 2-3 GM-% IV SOLR
2.0000 g | INTRAVENOUS | Status: AC
  Administered 2012-07-18: 2 g via INTRAVENOUS

## 2012-07-18 ENCOUNTER — Encounter (HOSPITAL_COMMUNITY)
Admission: RE | Disposition: A | Discharge status: Home / Self Care | Payer: Self-pay | Source: Ambulatory Visit | Attending: Orthopedic Surgery

## 2012-07-18 ENCOUNTER — Encounter (HOSPITAL_COMMUNITY): Payer: Self-pay | Admitting: Anesthesiology

## 2012-07-18 ENCOUNTER — Encounter (HOSPITAL_COMMUNITY): Payer: Self-pay | Admitting: *Deleted

## 2012-07-18 ENCOUNTER — Ambulatory Visit (HOSPITAL_COMMUNITY): Payer: BC Managed Care – PPO | Admitting: Anesthesiology

## 2012-07-18 ENCOUNTER — Inpatient Hospital Stay (HOSPITAL_COMMUNITY)
Admission: RE | Admit: 2012-07-18 | Discharge: 2012-07-19 | DRG: 209 | Disposition: A | Discharge status: Home / Self Care | Payer: BC Managed Care – PPO | Source: Ambulatory Visit | Attending: Orthopedic Surgery | Admitting: Orthopedic Surgery

## 2012-07-18 DIAGNOSIS — F419 Anxiety disorder, unspecified: Secondary | ICD-10-CM | POA: Diagnosis present

## 2012-07-18 DIAGNOSIS — F411 Generalized anxiety disorder: Secondary | ICD-10-CM | POA: Diagnosis present

## 2012-07-18 DIAGNOSIS — F330 Major depressive disorder, recurrent, mild: Secondary | ICD-10-CM | POA: Diagnosis present

## 2012-07-18 DIAGNOSIS — M1712 Unilateral primary osteoarthritis, left knee: Secondary | ICD-10-CM | POA: Diagnosis present

## 2012-07-18 DIAGNOSIS — M1710 Unilateral primary osteoarthritis, unspecified knee: Principal | ICD-10-CM | POA: Diagnosis present

## 2012-07-18 DIAGNOSIS — G43909 Migraine, unspecified, not intractable, without status migrainosus: Secondary | ICD-10-CM | POA: Diagnosis present

## 2012-07-18 DIAGNOSIS — Z01810 Encounter for preprocedural cardiovascular examination: Secondary | ICD-10-CM

## 2012-07-18 DIAGNOSIS — Z01812 Encounter for preprocedural laboratory examination: Secondary | ICD-10-CM

## 2012-07-18 DIAGNOSIS — Z01818 Encounter for other preprocedural examination: Secondary | ICD-10-CM

## 2012-07-18 DIAGNOSIS — Z79899 Other long term (current) drug therapy: Secondary | ICD-10-CM

## 2012-07-18 HISTORY — PX: TOTAL KNEE ARTHROPLASTY: SHX125

## 2012-07-18 LAB — CBC
HCT: 34.5 % — ABNORMAL LOW (ref 36.0–46.0)
Hemoglobin: 12 g/dL (ref 12.0–15.0)
MCH: 30.4 pg (ref 26.0–34.0)
MCV: 87.3 fL (ref 78.0–100.0)
RBC: 3.95 MIL/uL (ref 3.87–5.11)
WBC: 11.2 10*3/uL — ABNORMAL HIGH (ref 4.0–10.5)

## 2012-07-18 LAB — C-REACTIVE PROTEIN: CRP: 0.7 mg/dL — ABNORMAL HIGH (ref <0.60)

## 2012-07-18 LAB — CREATININE, SERUM: GFR calc Af Amer: >90 mL/min (ref >90)

## 2012-07-18 SURGERY — ARTHROPLASTY, KNEE, TOTAL
Anesthesia: General | Site: Knee | Laterality: Left | Wound class: Clean

## 2012-07-18 MED ORDER — GLYCOPYRROLATE 0.2 MG/ML IJ SOLN
INTRAMUSCULAR | Status: DC | PRN
  Administered 2012-07-18: 0.6 mg via INTRAVENOUS

## 2012-07-18 MED ORDER — OXYCODONE HCL ER 10 MG PO T12A
40.0000 mg | EXTENDED_RELEASE_TABLET | Freq: Two times a day (BID) | ORAL | Status: DC
  Administered 2012-07-18 – 2012-07-19 (×2): 40 mg via ORAL
  Filled 2012-07-18 (×2): qty 4

## 2012-07-18 MED ORDER — MENTHOL 3 MG MT LOZG: 1.0000 | LOZENGE | OROMUCOSAL | Status: DC | PRN

## 2012-07-18 MED ORDER — EPINEPHRINE HCL 0.1 MG/ML IJ SOSY
PREFILLED_SYRINGE | INTRAMUSCULAR | Status: DC | PRN
  Administered 2012-07-18: 5 ug via SUBCUTANEOUS

## 2012-07-18 MED ORDER — LACTATED RINGERS IV SOLN: INTRAVENOUS | Status: DC

## 2012-07-18 MED ORDER — ZOLPIDEM TARTRATE 5 MG PO TABS: 5.0000 mg | ORAL_TABLET | Freq: Every evening | ORAL | Status: DC | PRN

## 2012-07-18 MED ORDER — ONDANSETRON HCL 4 MG/2ML IJ SOLN: 4.0000 mg | Freq: Four times a day (QID) | INTRAMUSCULAR | Status: DC | PRN

## 2012-07-18 MED ORDER — SODIUM CHLORIDE 0.9 % IR SOLN
Status: DC | PRN
  Administered 2012-07-18: 1000 mL

## 2012-07-18 MED ORDER — DIAZEPAM 5 MG PO TABS
ORAL_TABLET | ORAL | Status: AC
  Administered 2012-07-18: 5 mg via ORAL
  Filled 2012-07-18: qty 1

## 2012-07-18 MED ORDER — DEXAMETHASONE 6 MG PO TABS
10.0000 mg | ORAL_TABLET | Freq: Three times a day (TID) | ORAL | Status: AC
  Administered 2012-07-18 – 2012-07-19 (×3): 10 mg via ORAL
  Filled 2012-07-18 (×3): qty 1

## 2012-07-18 MED ORDER — CELECOXIB 200 MG PO CAPS
200.0000 mg | ORAL_CAPSULE | Freq: Two times a day (BID) | ORAL | Status: DC
  Administered 2012-07-18 – 2012-07-19 (×2): 200 mg via ORAL
  Filled 2012-07-18 (×4): qty 1

## 2012-07-18 MED ORDER — MIDAZOLAM HCL 2 MG/2ML IJ SOLN
INTRAMUSCULAR | Status: AC
  Administered 2012-07-18: 0.5 mg via INTRAVENOUS
  Filled 2012-07-18: qty 2

## 2012-07-18 MED ORDER — TOPIRAMATE 25 MG PO TABS
25.0000 mg | ORAL_TABLET | Freq: Two times a day (BID) | ORAL | Status: DC
  Administered 2012-07-18 – 2012-07-19 (×2): 25 mg via ORAL
  Filled 2012-07-18 (×3): qty 1

## 2012-07-18 MED ORDER — HYDROMORPHONE HCL PF 1 MG/ML IJ SOLN
INTRAMUSCULAR | Status: AC
  Filled 2012-07-18: qty 1

## 2012-07-18 MED ORDER — ENOXAPARIN SODIUM 30 MG/0.3ML ~~LOC~~ SOLN
30.0000 mg | Freq: Two times a day (BID) | SUBCUTANEOUS | Status: DC
  Administered 2012-07-19: 30 mg via SUBCUTANEOUS
  Filled 2012-07-18 (×3): qty 0.3

## 2012-07-18 MED ORDER — CHLORHEXIDINE GLUCONATE 4 % EX LIQD: 60.0000 mL | Freq: Once | CUTANEOUS | Status: DC

## 2012-07-18 MED ORDER — CEFUROXIME SODIUM 1.5 G IJ SOLR
INTRAMUSCULAR | Status: DC | PRN
  Administered 2012-07-18: 1.5 g

## 2012-07-18 MED ORDER — DEXAMETHASONE SODIUM PHOSPHATE 10 MG/ML IJ SOLN
INTRAMUSCULAR | Status: DC | PRN
  Administered 2012-07-18: 8 mg via INTRAVENOUS

## 2012-07-18 MED ORDER — BUPIVACAINE-EPINEPHRINE PF 0.25-1:200000 % IJ SOLN
INTRAMUSCULAR | Status: AC
  Filled 2012-07-18: qty 30

## 2012-07-18 MED ORDER — ROPIVACAINE HCL 5 MG/ML IJ SOLN
INTRAMUSCULAR | Status: DC | PRN
  Administered 2012-07-18: 30 mL via EPIDURAL

## 2012-07-18 MED ORDER — BUPIVACAINE-EPINEPHRINE 0.25% -1:200000 IJ SOLN
INTRAMUSCULAR | Status: DC | PRN
  Administered 2012-07-18: 30 mL

## 2012-07-18 MED ORDER — SODIUM CHLORIDE 0.9 % IR SOLN
Status: DC | PRN
  Administered 2012-07-18: 3000 mL

## 2012-07-18 MED ORDER — BISACODYL 5 MG PO TBEC
10.0000 mg | DELAYED_RELEASE_TABLET | Freq: Every day | ORAL | Status: DC
  Administered 2012-07-18: 10 mg via ORAL
  Filled 2012-07-18: qty 2

## 2012-07-18 MED ORDER — ACETAMINOPHEN 10 MG/ML IV SOLN
1000.0000 mg | Freq: Four times a day (QID) | INTRAVENOUS | Status: DC
  Administered 2012-07-18 – 2012-07-19 (×3): 1000 mg via INTRAVENOUS
  Filled 2012-07-18 (×5): qty 100

## 2012-07-18 MED ORDER — POVIDONE-IODINE 7.5 % EX SOLN: Freq: Once | CUTANEOUS | Status: DC

## 2012-07-18 MED ORDER — ONDANSETRON HCL 4 MG/2ML IJ SOLN
INTRAMUSCULAR | Status: DC | PRN
  Administered 2012-07-18 (×2): 4 mg via INTRAVENOUS

## 2012-07-18 MED ORDER — ONDANSETRON HCL 4 MG PO TABS: 4.0000 mg | ORAL_TABLET | Freq: Four times a day (QID) | ORAL | Status: DC | PRN

## 2012-07-18 MED ORDER — FENTANYL CITRATE 0.05 MG/ML IJ SOLN
INTRAMUSCULAR | Status: AC
  Administered 2012-07-18: 100 ug via INTRAVENOUS
  Filled 2012-07-18: qty 2

## 2012-07-18 MED ORDER — ARTIFICIAL TEARS OP OINT
TOPICAL_OINTMENT | OPHTHALMIC | Status: DC | PRN
  Administered 2012-07-18: 1 via OPHTHALMIC

## 2012-07-18 MED ORDER — HYDROMORPHONE HCL PF 1 MG/ML IJ SOLN: 1.0000 mg | INTRAMUSCULAR | Status: DC | PRN

## 2012-07-18 MED ORDER — ACETAMINOPHEN 10 MG/ML IV SOLN
1000.0000 mg | Freq: Once | INTRAVENOUS | Status: AC
  Administered 2012-07-18: 1000 mg via INTRAVENOUS

## 2012-07-18 MED ORDER — ROCURONIUM BROMIDE 100 MG/10ML IV SOLN
INTRAVENOUS | Status: DC | PRN
  Administered 2012-07-18: 40 mg via INTRAVENOUS

## 2012-07-18 MED ORDER — PROPOFOL 10 MG/ML IV BOLUS
INTRAVENOUS | Status: DC | PRN
  Administered 2012-07-18: 30 mg via INTRAVENOUS
  Administered 2012-07-18: 160 mg via INTRAVENOUS

## 2012-07-18 MED ORDER — MIDAZOLAM HCL 5 MG/5ML IJ SOLN
INTRAMUSCULAR | Status: DC | PRN
  Administered 2012-07-18: 2 mg via INTRAVENOUS

## 2012-07-18 MED ORDER — CEFUROXIME SODIUM 750 MG IJ SOLR
INTRAMUSCULAR | Status: AC
  Filled 2012-07-18: qty 1500

## 2012-07-18 MED ORDER — MIDAZOLAM HCL 2 MG/2ML IJ SOLN: 0.5000 mg | Freq: Once | INTRAMUSCULAR | Status: AC

## 2012-07-18 MED ORDER — OXYCODONE HCL 5 MG PO TABS
ORAL_TABLET | ORAL | Status: AC
  Administered 2012-07-18: 10 mg via ORAL
  Filled 2012-07-18: qty 2

## 2012-07-18 MED ORDER — MIDAZOLAM HCL 2 MG/2ML IJ SOLN
INTRAMUSCULAR | Status: AC
  Administered 2012-07-18: 2 mg via INTRAVENOUS
  Filled 2012-07-18: qty 2

## 2012-07-18 MED ORDER — DEXAMETHASONE SODIUM PHOSPHATE 10 MG/ML IJ SOLN
10.0000 mg | Freq: Three times a day (TID) | INTRAMUSCULAR | Status: AC
  Filled 2012-07-18 (×3): qty 1

## 2012-07-18 MED ORDER — FENTANYL CITRATE 0.05 MG/ML IJ SOLN
INTRAMUSCULAR | Status: DC | PRN
  Administered 2012-07-18: 50 ug via INTRAVENOUS
  Administered 2012-07-18 (×3): 100 ug via INTRAVENOUS

## 2012-07-18 MED ORDER — LACTATED RINGERS IV SOLN
INTRAVENOUS | Status: DC | PRN
  Administered 2012-07-18 (×2): via INTRAVENOUS

## 2012-07-18 MED ORDER — OXYCODONE HCL 5 MG PO TABS
5.0000 mg | ORAL_TABLET | ORAL | Status: DC | PRN
  Administered 2012-07-18 – 2012-07-19 (×5): 10 mg via ORAL
  Filled 2012-07-18 (×5): qty 2

## 2012-07-18 MED ORDER — HYDROMORPHONE HCL PF 1 MG/ML IJ SOLN
INTRAMUSCULAR | Status: AC
  Filled 2012-07-18: qty 2

## 2012-07-18 MED ORDER — CEFAZOLIN SODIUM-DEXTROSE 2-3 GM-% IV SOLR
2.0000 g | Freq: Four times a day (QID) | INTRAVENOUS | Status: AC
  Administered 2012-07-18 (×2): 2 g via INTRAVENOUS
  Filled 2012-07-18 (×2): qty 50

## 2012-07-18 MED ORDER — MIDAZOLAM HCL 2 MG/2ML IJ SOLN
0.5000 mg | Freq: Once | INTRAMUSCULAR | Status: AC
  Administered 2012-07-18: 0.5 mg via INTRAVENOUS

## 2012-07-18 MED ORDER — ALUM & MAG HYDROXIDE-SIMETH 200-200-20 MG/5ML PO SUSP: 30.0000 mL | ORAL | Status: DC | PRN

## 2012-07-18 MED ORDER — SERTRALINE HCL 50 MG PO TABS
50.0000 mg | ORAL_TABLET | Freq: Every day | ORAL | Status: DC
  Administered 2012-07-18: 50 mg via ORAL
  Filled 2012-07-18 (×2): qty 1

## 2012-07-18 MED ORDER — METOCLOPRAMIDE HCL 10 MG PO TABS: 5.0000 mg | ORAL_TABLET | Freq: Three times a day (TID) | ORAL | Status: DC | PRN

## 2012-07-18 MED ORDER — DOCUSATE SODIUM 100 MG PO CAPS
100.0000 mg | ORAL_CAPSULE | Freq: Two times a day (BID) | ORAL | Status: DC
  Administered 2012-07-18 – 2012-07-19 (×2): 100 mg via ORAL
  Filled 2012-07-18 (×3): qty 1

## 2012-07-18 MED ORDER — ACETAMINOPHEN 10 MG/ML IV SOLN
INTRAVENOUS | Status: AC
  Filled 2012-07-18: qty 100

## 2012-07-18 MED ORDER — HYDROMORPHONE HCL PF 1 MG/ML IJ SOLN
0.2500 mg | INTRAMUSCULAR | Status: DC | PRN
  Administered 2012-07-18 (×2): 0.5 mg via INTRAVENOUS
  Administered 2012-07-18: 12:00:00 via INTRAVENOUS
  Administered 2012-07-18 (×4): 0.5 mg via INTRAVENOUS

## 2012-07-18 MED ORDER — POTASSIUM CHLORIDE IN NACL 20-0.9 MEQ/L-% IV SOLN
INTRAVENOUS | Status: DC
  Administered 2012-07-18: 18:00:00 via INTRAVENOUS
  Filled 2012-07-18 (×4): qty 1000

## 2012-07-18 MED ORDER — PHENOL 1.4 % MT LIQD: 1.0000 | OROMUCOSAL | Status: DC | PRN

## 2012-07-18 MED ORDER — METOCLOPRAMIDE HCL 5 MG/ML IJ SOLN
5.0000 mg | Freq: Three times a day (TID) | INTRAMUSCULAR | Status: DC | PRN
Start: 2012-07-18 — End: 2012-07-19

## 2012-07-18 MED ORDER — BUSPIRONE HCL 10 MG PO TABS
10.0000 mg | ORAL_TABLET | Freq: Three times a day (TID) | ORAL | Status: DC
  Administered 2012-07-18 – 2012-07-19 (×3): 10 mg via ORAL
  Filled 2012-07-18 (×5): qty 1

## 2012-07-18 MED ORDER — LIDOCAINE HCL (CARDIAC) 20 MG/ML IV SOLN
INTRAVENOUS | Status: DC | PRN
  Administered 2012-07-18: 60 mg via INTRAVENOUS

## 2012-07-18 MED ORDER — DIPHENHYDRAMINE HCL 12.5 MG/5ML PO ELIX: 12.5000 mg | ORAL_SOLUTION | ORAL | Status: DC | PRN

## 2012-07-18 MED ORDER — DIAZEPAM 5 MG PO TABS
5.0000 mg | ORAL_TABLET | Freq: Four times a day (QID) | ORAL | Status: DC | PRN
  Administered 2012-07-18 – 2012-07-19 (×2): 5 mg via ORAL
  Filled 2012-07-18 (×3): qty 1

## 2012-07-18 MED ORDER — NEOSTIGMINE METHYLSULFATE 1 MG/ML IJ SOLN
INTRAMUSCULAR | Status: DC | PRN
  Administered 2012-07-18: 4 mg via INTRAVENOUS

## 2012-07-18 SURGICAL SUPPLY — 75 items
BANDAGE ELASTIC 6 VELCRO ST LF (GAUZE/BANDAGES/DRESSINGS) ×1 IMPLANT
BANDAGE ESMARK 6X9 LF (GAUZE/BANDAGES/DRESSINGS) ×1 IMPLANT
BLADE SAGITTAL 25.0X1.19X90 (BLADE) ×2 IMPLANT
BLADE SAW SGTL 11.0X1.19X90.0M (BLADE) IMPLANT
BLADE SAW SGTL 13.0X1.19X90.0M (BLADE) ×2 IMPLANT
BLADE SURG 10 STRL SS (BLADE) ×4 IMPLANT
BNDG CMPR 9X6 STRL LF SNTH (GAUZE/BANDAGES/DRESSINGS) ×1
BNDG CMPR MED 15X6 ELC VLCR LF (GAUZE/BANDAGES/DRESSINGS) ×1
BNDG ELASTIC 6X15 VLCR STRL LF (GAUZE/BANDAGES/DRESSINGS) ×2 IMPLANT
BNDG ESMARK 6X9 LF (GAUZE/BANDAGES/DRESSINGS) ×2
BOWL SMART MIX CTS (DISPOSABLE) ×2 IMPLANT
CEMENT HV SMART SET (Cement) ×4 IMPLANT
CLOTH BEACON ORANGE TIMEOUT ST (SAFETY) ×2 IMPLANT
CLSR STERI-STRIP ANTIMIC 1/2X4 (GAUZE/BANDAGES/DRESSINGS) ×1 IMPLANT
COVER SURGICAL LIGHT HANDLE (MISCELLANEOUS) ×2 IMPLANT
CUFF TOURNIQUET SINGLE 34IN LL (TOURNIQUET CUFF) ×2 IMPLANT
CUFF TOURNIQUET SINGLE 44IN (TOURNIQUET CUFF) IMPLANT
DRAPE EXTREMITY T 121X128X90 (DRAPE) ×2 IMPLANT
DRAPE INCISE IOBAN 66X45 STRL (DRAPES) ×2 IMPLANT
DRAPE PROXIMA HALF (DRAPES) ×2 IMPLANT
DRAPE U-SHAPE 47X51 STRL (DRAPES) ×2 IMPLANT
DRSG ADAPTIC 3X8 NADH LF (GAUZE/BANDAGES/DRESSINGS) ×2 IMPLANT
DRSG PAD ABDOMINAL 8X10 ST (GAUZE/BANDAGES/DRESSINGS) ×5 IMPLANT
DURAPREP 26ML APPLICATOR (WOUND CARE) ×2 IMPLANT
ELECT CAUTERY BLADE 6.4 (BLADE) ×2 IMPLANT
ELECT REM PT RETURN 9FT ADLT (ELECTROSURGICAL) ×2
ELECTRODE REM PT RTRN 9FT ADLT (ELECTROSURGICAL) ×1 IMPLANT
EVACUATOR 1/8 PVC DRAIN (DRAIN) ×1 IMPLANT
FACESHIELD LNG OPTICON STERILE (SAFETY) ×2 IMPLANT
GLOVE BIO SURGEON STRL SZ7 (GLOVE) ×2 IMPLANT
GLOVE BIOGEL PI IND STRL 7.0 (GLOVE) ×1 IMPLANT
GLOVE BIOGEL PI IND STRL 7.5 (GLOVE) ×1 IMPLANT
GLOVE BIOGEL PI INDICATOR 7.0 (GLOVE) ×2
GLOVE BIOGEL PI INDICATOR 7.5 (GLOVE) ×1
GLOVE SS BIOGEL STRL SZ 7.5 (GLOVE) ×1 IMPLANT
GLOVE SUPERSENSE BIOGEL SZ 7.5 (GLOVE) ×1
GLOVE SURG SS PI 6.5 STRL IVOR (GLOVE) ×1 IMPLANT
GOWN PREVENTION PLUS XLARGE (GOWN DISPOSABLE) ×4 IMPLANT
GOWN STRL NON-REIN LRG LVL3 (GOWN DISPOSABLE) ×4 IMPLANT
HANDPIECE INTERPULSE COAX TIP (DISPOSABLE) ×2
HOOD PEEL AWAY FACE SHEILD DIS (HOOD) ×4 IMPLANT
IMMOBILIZER KNEE 22 UNIV (SOFTGOODS) ×1 IMPLANT
INSERT CUSHION PRONEVIEW LG (MISCELLANEOUS) ×2 IMPLANT
KIT BASIN OR (CUSTOM PROCEDURE TRAY) ×2 IMPLANT
KIT ROOM TURNOVER OR (KITS) ×2 IMPLANT
MANIFOLD NEPTUNE II (INSTRUMENTS) ×2 IMPLANT
NS IRRIG 1000ML POUR BTL (IV SOLUTION) ×2 IMPLANT
PACK TOTAL JOINT (CUSTOM PROCEDURE TRAY) ×2 IMPLANT
PAD ARMBOARD 7.5X6 YLW CONV (MISCELLANEOUS) ×4 IMPLANT
PAD CAST 4YDX4 CTTN HI CHSV (CAST SUPPLIES) ×1 IMPLANT
PADDING CAST ABS 4INX4YD NS (CAST SUPPLIES) ×1
PADDING CAST ABS 6INX4YD NS (CAST SUPPLIES) ×1
PADDING CAST ABS COTTON 4X4 ST (CAST SUPPLIES) IMPLANT
PADDING CAST ABS COTTON 6X4 NS (CAST SUPPLIES) IMPLANT
PADDING CAST COTTON 4X4 STRL (CAST SUPPLIES) ×2
PADDING CAST COTTON 6X4 STRL (CAST SUPPLIES) ×2 IMPLANT
POSITIONER HEAD PRONE TRACH (MISCELLANEOUS) ×2 IMPLANT
RUBBERBAND STERILE (MISCELLANEOUS) ×2 IMPLANT
SET HNDPC FAN SPRY TIP SCT (DISPOSABLE) ×1 IMPLANT
SPONGE GAUZE 4X4 12PLY (GAUZE/BANDAGES/DRESSINGS) ×2 IMPLANT
STRIP CLOSURE SKIN 1/2X4 (GAUZE/BANDAGES/DRESSINGS) ×2 IMPLANT
SUCTION FRAZIER TIP 10 FR DISP (SUCTIONS) ×2 IMPLANT
SUT ETHIBOND NAB CT1 #1 30IN (SUTURE) ×4 IMPLANT
SUT MNCRL AB 3-0 PS2 18 (SUTURE) ×2 IMPLANT
SUT VIC AB 0 CT1 27 (SUTURE) ×4
SUT VIC AB 0 CT1 27XBRD ANBCTR (SUTURE) ×2 IMPLANT
SUT VIC AB 2-0 CT1 27 (SUTURE) ×4
SUT VIC AB 2-0 CT1 TAPERPNT 27 (SUTURE) ×2 IMPLANT
SYR 30ML SLIP (SYRINGE) ×2 IMPLANT
TOWEL OR 17X24 6PK STRL BLUE (TOWEL DISPOSABLE) ×2 IMPLANT
TOWEL OR 17X26 10 PK STRL BLUE (TOWEL DISPOSABLE) ×2 IMPLANT
TRAY FOLEY CATH 14FR (SET/KITS/TRAYS/PACK) ×2 IMPLANT
TUBE CONNECTING 12X1/4 (SUCTIONS) ×1 IMPLANT
WATER STERILE IRR 1000ML POUR (IV SOLUTION) ×4 IMPLANT
YANKAUER SUCT BULB TIP NO VENT (SUCTIONS) ×1 IMPLANT

## 2012-07-18 NOTE — Progress Notes (Signed)
Utilization review complete. Sriram Febles RN CCM Case Mgmt phone [phone] 

## 2012-07-18 NOTE — Transfer of Care (Signed)
Immediate Anesthesia Transfer of Care Note  Patient: Melissa Blake  Procedure(s) Performed: Procedure(s): TOTAL KNEE ARTHROPLASTY (Left)  Patient Location: PACU  Anesthesia Type:General  Level of Consciousness: awake and patient cooperative  Airway & Oxygen Therapy: Patient Spontanous Breathing and Patient connected to nasal cannula oxygen  Post-op Assessment: Report given to PACU RN, Post -op Vital signs reviewed and stable and Patient moving all extremities X 4  Post vital signs: Reviewed and stable  Complications: No apparent anesthesia complications

## 2012-07-18 NOTE — H&P (View-Only) (Signed)
TOTAL KNEE ADMISSION H&P  Patient is being admitted for left total knee arthroplasty.  Subjective:  Chief Complaint:left knee pain.  HPI: Melissa Blake, 47 y.o. female, has a history of pain and functional disability in the left knee due to trauma and arthritis and has failed non-surgical conservative treatments for greater than 12 weeks to includeNSAID's and/or analgesics, corticosteriod injections, flexibility and strengthening excercises, supervised PT with diminished ADL's post treatment, use of assistive devices, weight reduction as appropriate and activity modification.  Onset of symptoms was gradual, starting 4 years ago with rapidlly worsening course since that time. The patient noted prior procedures on the knee to include  arthroscopy and menisectomy on the left knee(s).  Patient currently rates pain in the left knee(s) at 10 out of 10 with activity. Patient has night pain, worsening of pain with activity and weight bearing, pain that interferes with activities of daily living, crepitus and joint swelling.  Patient has evidence of subchondral sclerosis, periarticular osteophytes and joint space narrowing by imaging studies.  There is no active infection.  Patient Active Problem List   Diagnosis Date Noted  . MDD (major depressive disorder), recurrent episode, mild 09/19/2011  . Anxiety disorder 09/19/2011  . WOUND, OPEN, HAND, WITHOUT COMPLICATIONS 11/07/2010   Past Medical History  Diagnosis Date  . Meniscus tear 04/2011  . Migraines   . Degenerative arthritis of left knee   . Anxiety     Past Surgical History  Procedure Laterality Date  . Meniscus repair  08-13-11  . Urethra surgery  2008  . Myomectomy  2011     (Not in a hospital admission) Allergies  Allergen Reactions  . Diflucan (Fluconazole) Hives    History  Substance Use Topics  . Smoking status: Never Smoker   . Smokeless tobacco: Not on file  . Alcohol Use: 1.0 oz/week    2 drink(s) per week     Comment:  1-2 beers a month    Family History  Problem Relation Age of Onset  . Hypertension Father   . Asthma Father   . Nephrolithiasis Father   . Depression Maternal Aunt   . Diabetes type II Maternal Grandmother      Review of Systems  Constitutional: Negative.   HENT: Negative.   Eyes: Negative.   Respiratory: Negative.   Cardiovascular: Negative.   Gastrointestinal: Negative.   Genitourinary: Negative.   Musculoskeletal: Positive for joint pain.       Left knee  Skin: Positive for rash.  Neurological: Negative.   Psychiatric/Behavioral: Negative.     Objective:  Physical Exam  Constitutional: She is oriented to person, place, and time. She appears well-developed and well-nourished.  HENT:  Head: Normocephalic and atraumatic.  Eyes: Conjunctivae and EOM are normal. Pupils are equal, round, and reactive to light.  Neck: Normal range of motion.  Cardiovascular: Normal rate and regular rhythm.   Respiratory: Effort normal.  GI: Soft.  Genitourinary:  Not pertinent to current symptomatology therefore not examined.  Musculoskeletal: She exhibits tenderness.   Examination of her left knee reveals pain medially and laterally 1+ crepitation 1+ synovitis range of motion 0-120 degrees moderate varus deformity knee is stable to ligamentous exam with normal patella tracking. Exam of her right knee reveals full range of motion without pain swelling weakness or instability. Vascular exam: pulses 2+ and symmetric.  Neurological: She is alert and oriented to person, place, and time.  Skin: Skin is warm and dry.  Psychiatric: She has a normal mood and  affect. Her behavior is normal. Thought content normal.    Vital signs in last 24 hours: Last recorded: 05/08 1500   BP: 122/86 Pulse: 79  Temp: 98.1 F (36.7 C)    Height: 5\' 7"  (1.702 m) SpO2: 98  Weight: 109.77 kg (242 lb)     Labs:   Estimated body mass index is 37.89 kg/(m^2) as calculated from the following:   Height as of  this encounter: 5\' 7"  (1.702 m).   Weight as of this encounter: 109.77 kg (242 lb).   Imaging Review Plain radiographs demonstrate severe degenerative joint disease of the left knee(s). The overall alignment issignificant varus. The bone quality appears to be good for age and reported activity level.  Assessment/Plan:  End stage arthritis, left knee   The patient history, physical examination, clinical judgment of the provider and imaging studies are consistent with end stage degenerative joint disease of the left knee(s) and total knee arthroplasty is deemed medically necessary. The treatment options including medical management, injection therapy arthroscopy and arthroplasty were discussed at length. The risks and benefits of total knee arthroplasty were presented and reviewed. The risks due to aseptic loosening, infection, stiffness, patella tracking problems, thromboembolic complications and other imponderables were discussed. The patient acknowledged the explanation, agreed to proceed with the plan and consent was signed. Patient is being admitted for inpatient treatment for surgery, pain control, PT, OT, prophylactic antibiotics, VTE prophylaxis, progressive ambulation and ADL's and discharge planning. The patient is planning to be discharged to skilled nursing facility vs going home  Maude Gloor A. Gwinda Passe Physician Assistant [facility] [phone]  07/07/2012, 5:05 PM

## 2012-07-18 NOTE — Evaluation (Signed)
Physical Therapy Evaluation Patient Details Name: Melissa Blake MRN: [id number] DOB: [birth year] Today's Date: 07/18/2012 Time: 9528-4132 PT Time Calculation (min): 21 min  PT Assessment / Plan / Recommendation Clinical Impression  Patient is a 47 yo female s/p Lt TKA.  Will benefit from acute PT to maximize independence prior to return home.  Recommend HHPT.    PT Assessment  Patient needs continued PT services    Follow Up Recommendations  Home health PT;Supervision/Assistance - 24 hour    Does the patient have the potential to tolerate intense rehabilitation      Barriers to Discharge None      Equipment Recommendations  None recommended by PT    Recommendations for Other Services     Frequency 7X/week    Precautions / Restrictions Precautions Precautions: Knee Precaution Booklet Issued: Yes (comment) Precaution Comments: Provided education on precautions to patient. Required Braces or Orthoses: Knee Immobilizer - Left Knee Immobilizer - Left: On except when in CPM;Discontinue once straight leg raise with < 10 degree lag Restrictions Weight Bearing Restrictions: Yes LLE Weight Bearing: Weight bearing as tolerated   Pertinent Vitals/Pain Pain limiting mobility      Mobility  Bed Mobility Bed Mobility: Supine to Sit;Sitting - Scoot to Edge of Bed;Sit to Supine Supine to Sit: 3: Mod assist;With rails;HOB flat Sitting - Scoot to Edge of Bed: 4: Min assist Sit to Supine: 3: Mod assist;With rail;HOB flat Details for Bed Mobility Assistance: Instructed patient on donning KI. Verbal cues for technique.  Assist to move LLE off of and onto bed.  Patient sat EOB x 10 minutes with good balance. Transfers Transfers: Not assessed    Exercises Total Joint Exercises Ankle Circles/Pumps: AROM;Both;10 reps;Seated   PT Diagnosis: Difficulty walking;Abnormality of gait;Acute pain  PT Problem List: Decreased strength;Decreased range of motion;Decreased activity  tolerance;Decreased balance;Decreased mobility;Decreased knowledge of use of DME;Decreased knowledge of precautions;Pain PT Treatment Interventions: DME instruction;Gait training;Stair training;Functional mobility training;Therapeutic exercise;Patient/family education   PT Goals Acute Rehab PT Goals PT Goal Formulation: With patient Time For Goal Achievement: 07/25/12 Potential to Achieve Goals: Good Pt will go Supine/Side to Sit: with supervision;with HOB 0 degrees PT Goal: Supine/Side to Sit - Progress: Goal set today Pt will go Sit to Supine/Side: with supervision;with HOB 0 degrees PT Goal: Sit to Supine/Side - Progress: Goal set today Pt will go Sit to Stand: with supervision;with upper extremity assist PT Goal: Sit to Stand - Progress: Goal set today Pt will Ambulate: 51 - 150 feet;with supervision;with rolling walker PT Goal: Ambulate - Progress: Goal set today Pt will Go Up / Down Stairs: 3-5 stairs;with min assist;with least restrictive assistive device PT Goal: Up/Down Stairs - Progress: Goal set today Pt will Perform Home Exercise Program: with supervision, verbal cues required/provided PT Goal: Perform Home Exercise Program - Progress: Goal set today  Visit Information  Last PT Received On: 07/18/12 Assistance Needed: +2    Subjective Data  Subjective: "I have a low pain tolerance" Patient Stated Goal: To walk   Prior Functioning  Home Living Lives With: Daughter Available Help at Discharge: Family;Available 24 hours/day (Mother staying with patient) Type of Home: House Home Access: Stairs to enter Secretary/administrator of Steps: 3 Entrance Stairs-Rails: None Home Layout: One level Bathroom Shower/Tub: Health visitor: Standard Bathroom Accessibility: Yes How Accessible: Accessible via walker Home Adaptive Equipment: Bedside commode/3-in-1;Walker - rolling Prior Function Level of Independence: Independent Able to Take Stairs?: Yes Driving:  Yes 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 Communication Communication: No difficulties    Cognition  Cognition Arousal/Alertness: Lethargic Behavior During Therapy: WFL for tasks assessed/performed Overall Cognitive Status: Within Functional Limits for tasks assessed    Extremity/Trunk Assessment Right Upper Extremity Assessment RUE ROM/Strength/Tone: WFL for tasks assessed RUE Sensation: WFL - Light Touch Left Upper Extremity Assessment LUE ROM/Strength/Tone: WFL for tasks assessed LUE Sensation: WFL - Light Touch Right Lower Extremity Assessment RLE ROM/Strength/Tone: WFL for tasks assessed RLE Sensation: WFL - Light Touch Left Lower Extremity Assessment LLE ROM/Strength/Tone: Deficits;Unable to fully assess;Due to pain LLE ROM/Strength/Tone Deficits: Able to assist with moving LLE off of bed.   Balance Balance Balance Assessed: Yes Static Sitting Balance Static Sitting - Balance Support: No upper extremity supported;Feet supported Static Sitting - Level of Assistance: 5: Stand by assistance Static Sitting - Comment/# of Minutes: 10  End of Session PT - End of Session Equipment Utilized During Treatment: Left knee immobilizer;Oxygen Activity Tolerance: Patient limited by pain;Patient limited by fatigue Patient left: in bed;with call bell/phone within reach Nurse Communication: Mobility status;Patient requests pain meds (Off CPM at 18:25) CPM Left Knee CPM Left Knee: Off  GP     Vena Austria 07/18/2012, 8:19 PM Durenda Hurt. Renaldo Fiddler, [facility] Acute Rehab Services Pager [phone]

## 2012-07-18 NOTE — Anesthesia Procedure Notes (Signed)
Anesthesia Regional Block:  Femoral nerve block  Pre-Anesthetic Checklist: ,, timeout performed, Correct Patient, Correct Site, Correct Laterality, Correct Procedure, Correct Position, site marked, Risks and benefits discussed,  Surgical consent,  Pre-op evaluation,  At surgeon's request and post-op pain management  Laterality: Left  Prep: chloraprep       Needles:   Needle Type: Echogenic Needle          Additional Needles:  Procedures: Doppler guided and nerve stimulator Femoral nerve block Narrative:  Start time: 07/18/2012 8:00 AM End time: 07/18/2012 8:20 AM Injection made incrementally with aspirations every 5 mL.  Events: blood aspirated  Performed by: Personally  Anesthesiologist: Dr. Randa Evens

## 2012-07-18 NOTE — Anesthesia Postprocedure Evaluation (Signed)
  Anesthesia Post-op Note  Patient: Melissa Blake  Procedure(s) Performed: Procedure(s): TOTAL KNEE ARTHROPLASTY (Left)  Patient Location: PACU  Anesthesia Type:General  Level of Consciousness: awake  Airway and Oxygen Therapy: Patient Spontanous Breathing  Post-op Pain: mild  Post-op Assessment: Post-op Vital signs reviewed  Post-op Vital Signs: Reviewed  Complications: No apparent anesthesia complications

## 2012-07-18 NOTE — Preoperative (Signed)
Beta Blockers   Reason not to administer Beta Blockers:Not Applicable 

## 2012-07-18 NOTE — Progress Notes (Signed)
Pt. Arrived from the OR on a bed.  Extremely anxious and crying in pain.  Dilaudid given and verbal deescalation used to reassure patient.

## 2012-07-18 NOTE — Interval H&P Note (Signed)
History and Physical Interval Note:  07/18/2012 9:12 AM  Melissa Blake  has presented today for surgery, with the diagnosis of DJD LEFT KNEE  The various methods of treatment have been discussed with the patient and family. After consideration of risks, benefits and other options for treatment, the patient has consented to  Procedure(s): TOTAL KNEE ARTHROPLASTY (Left) as a surgical intervention .  The patient's history has been reviewed, patient examined, no change in status, stable for surgery.  I have reviewed the patient's chart and labs.  Questions were answered to the patient's satisfaction.     Salvatore Marvel A

## 2012-07-18 NOTE — Progress Notes (Signed)
Orthopedic Tech Progress Note Patient Details:  Melissa Blake 1965-09-01 [id number] Applied CPM to LLE, left Footsie Roll with pt.'s nurse. CPM Left Knee CPM Left Knee: On Left Knee Flexion (Degrees): 60 Left Knee Extension (Degrees): 0   Lesle Chris 07/18/2012, 1:28 PM

## 2012-07-18 NOTE — Anesthesia Preprocedure Evaluation (Addendum)
Anesthesia Evaluation  Patient identified by MRN, date of birth, ID band Patient awake    Reviewed: Allergy & Precautions, H&P , Patient's Chart, lab work & pertinent test results  Airway Mallampati: II      Dental   Pulmonary neg pulmonary ROS,  breath sounds clear to auscultation        Cardiovascular negative cardio ROS  Rhythm:Regular Rate:Normal     Neuro/Psych  Headaches, Anxiety Depression    GI/Hepatic negative GI ROS, Neg liver ROS,   Endo/Other  negative endocrine ROS  Renal/GU negative Renal ROS     Musculoskeletal   Abdominal   Peds  Hematology   Anesthesia Other Findings   Reproductive/Obstetrics                        Anesthesia Physical Anesthesia Plan  ASA: II  Anesthesia Plan:    Post-op Pain Management:    Induction: Intravenous  Airway Management Planned: Oral ETT  Additional Equipment:   Intra-op Plan:   Post-operative Plan: Extubation in OR  Informed Consent: I have reviewed the patients History and Physical, chart, labs and discussed the procedure including the risks, benefits and alternatives for the proposed anesthesia with the patient or authorized representative who has indicated his/her understanding and acceptance.   Dental advisory given  Plan Discussed with: CRNA, Anesthesiologist and Surgeon  Anesthesia Plan Comments:       Anesthesia Quick Evaluation

## 2012-07-18 NOTE — Op Note (Signed)
MRN:     [id number] DOB/AGE:    Nov 11, 1965 / 47 y.o.       OPERATIVE REPORT    DATE OF PROCEDURE:  07/18/2012       PREOPERATIVE DIAGNOSIS:   DJD LEFT KNEE      Estimated body mass index is 40.85 kg/(m^2) as calculated from the following:   Height as of 07/11/12: 5\' 6"  (1.676 m).   Weight as of 04/22/12: 114.76 kg (253 lb).                                                        POSTOPERATIVE DIAGNOSIS:   DJD LEFT KNEE                                                                      PROCEDURE:  Procedure(s): TOTAL KNEE ARTHROPLASTY Using Depuy Sigma RP implants #4 narrow Femur, #4Tibia, 10mm sigma RP bearing, 32 Patella     SURGEON: Destynee Stringfellow A    ASSISTANT:  Kirstin Shepperson PA-C   (Present and scrubbed throughout the case, critical for assistance with exposure, retraction, instrumentation, and closure.)         ANESTHESIA: GET with Femoral Nerve Block  DRAINS: foley, 2 medium hemovac in knee SPECIMEN:  Femoral bone for pathology   TOURNIQUET TIME:   COMPLICATIONS:  None     SPECIMENS: None   INDICATIONS FOR PROCEDURE: The patient has  DJD LEFT KNEE, varus deformities, XR shows bone on bone arthritis. Patient has failed all conservative measures including anti-inflammatory medicines, narcotics, attempts at  exercise and weight loss, cortisone injections and viscosupplementation.  Risks and benefits of surgery have been discussed, questions answered.   DESCRIPTION OF PROCEDURE: The patient identified by armband, received  right femoral nerve block and IV antibiotics, in the holding area at [facility]. Patient taken to the operating room, appropriate anesthetic  monitors were attached General endotracheal anesthesia induced with  the patient in supine position, Foley catheter was inserted. Tourniquet  applied high to the operative thigh. Lateral post and foot positioner  applied to the table, the lower extremity was then prepped and draped  in usual sterile  fashion from the ankle to the tourniquet. Time-out procedure was performed. The limb was wrapped with an Esmarch bandage and the tourniquet inflated to 365 mmHg. We began the operation by making the anterior midline incision starting at handbreadth above the patella going over the patella 1 cm medial to and  4 cm distal to the tibial tubercle. Small bleeders in the skin and the  subcutaneous tissue identified and cauterized. Transverse retinaculum was incised and reflected medially and a medial parapatellar arthrotomy was accomplished. the patella was everted and theprepatellar fat pad resected. The superficial medial collateral  ligament was then elevated from anterior to posterior along the proximal  flare of the tibia and anterior half of the menisci resected. The knee was hyperflexed exposing bone on bone arthritis. Peripheral and notch osteophytes as well as the cruciate ligaments were then resected. We continued to  work our way around posteriorly along  the proximal tibia, and externally  rotated the tibia subluxing it out from underneath the femur. A McHale  retractor was placed through the notch and a lateral Hohmann retractor  placed, and we then drilled through the proximal tibia in line with the  axis of the tibia followed by an intramedullary guide rod and 2-degree  posterior slope cutting guide. The tibial cutting guide was pinned into place  allowing resection of 4 mm of bone medially and about 6 mm of bone  laterally because of her varus deformity. Satisfied with the tibial resection, we then  entered the distal femur 2 mm anterior to the PCL origin with the  intramedullary guide rod and applied the distal femoral cutting guide  set at 11mm, with 5 degrees of valgus. This was pinned along the  epicondylar axis. At this point, the distal femoral cut was accomplished without difficulty. The cartilage had an unusual blue color to it and thus I thought in necessary to send bone for  pathology. The synovium did not appear pathologic. We then sized for a #4 Narrow femoral component and pinned the guide in 3 degrees of external rotation.The chamfer cutting guide was pinned into place. The anterior, posterior, and chamfer cuts were accomplished without difficulty followed by  the Sigma RP box cutting guide and the box cut. We also removed posterior osteophytes from the posterior femoral condyles. At this  time, the knee was brought into full extension. We checked our  extension and flexion gaps and found them symmetric at 10mm.  The patella thickness measured at 21 mm. We set the cutting guide at 13 and removed the posterior 8.5 mm  of the patella sized for 32 button and drilled the lollipop. The knee  was then once again hyperflexed exposing the proximal tibia. We sized for a #4 tibial base plate, applied the smokestack and the conical reamer followed by the the Delta fin keel punch. We then hammered into place the Sigma RP trial femoral component, inserted a 10-mm trial bearing, trial patellar button, and took the knee through range of motion from 0-130 degrees. No thumb pressure was required for patellar  tracking. At this point, all trial components were removed, a double batch of DePuy HV cement with 1500 mg of Zinacef was mixed and applied to all bony metallic mating surfaces except for the posterior condyles of the femur itself. In order, we  hammered into place the tibial tray and removed excess cement, the femoral component and removed excess cement, a 10-mm Sigma RP bearing  was inserted, and the knee brought to full extension with compression.  The patellar button was clamped into place, and excess cement  removed. While the cement cured the wound was irrigated out with normal saline solution pulse lavage, and medium Hemovac drains were placed.. Ligament stability and patellar tracking were checked and found to be excellent. The tourniquet was then released and hemostasis was  obtained with cautery. The parapatellar arthrotomy was closed with  #1 ethibond suture. The subcutaneous tissue with 0 and 2-0 undyed  Vicryl suture, and 4-0 Monocryl.. A dressing of Xeroform,  4 x 4, dressing sponges, Webril, and Ace wrap applied. Needle and sponge count were correct times 2.The patient awakened, extubated, and taken to recovery room without difficulty. Vascular status was normal, pulses 2+ and symmetric.   Ha Shannahan A 07/18/2012, 10:48 AM

## 2012-07-19 ENCOUNTER — Encounter (HOSPITAL_COMMUNITY): Payer: Self-pay | Admitting: Orthopedic Surgery

## 2012-07-19 LAB — BASIC METABOLIC PANEL
BUN: 7 mg/dL (ref 6–23)
CO2: 21 mEq/L (ref 19–32)
Chloride: 108 mEq/L (ref 96–112)
Creatinine, Ser: 0.72 mg/dL (ref 0.50–1.10)
GFR calc Af Amer: >90 mL/min (ref >90)
Glucose, Bld: 159 mg/dL — ABNORMAL HIGH (ref 70–99)
Potassium: 4.1 mEq/L (ref 3.5–5.1)

## 2012-07-19 LAB — CBC
HCT: 32.2 % — ABNORMAL LOW (ref 36.0–46.0)
Hemoglobin: 11.1 g/dL — ABNORMAL LOW (ref 12.0–15.0)
MCV: 86.6 fL (ref 78.0–100.0)
RBC: 3.72 MIL/uL — ABNORMAL LOW (ref 3.87–5.11)
RDW: 12.7 % (ref 11.5–15.5)
WBC: 9.2 10*3/uL (ref 4.0–10.5)

## 2012-07-19 MED ORDER — OXYCODONE HCL 5 MG PO TABS: ORAL_TABLET | ORAL | Status: DC

## 2012-07-19 MED ORDER — ENOXAPARIN (LOVENOX) PATIENT EDUCATION KIT
PACK | Freq: Once | Status: AC
  Administered 2012-07-19: 10:00:00
  Filled 2012-07-19: qty 1

## 2012-07-19 MED ORDER — BISACODYL 5 MG PO TBEC: 10.0000 mg | DELAYED_RELEASE_TABLET | Freq: Every day | ORAL | Status: DC

## 2012-07-19 MED ORDER — ENOXAPARIN SODIUM 30 MG/0.3ML ~~LOC~~ SOLN: 30.0000 mg | Freq: Two times a day (BID) | SUBCUTANEOUS | Status: DC

## 2012-07-19 MED ORDER — OXYCODONE HCL ER 40 MG PO T12A: 40.0000 mg | EXTENDED_RELEASE_TABLET | Freq: Two times a day (BID) | ORAL | Status: DC

## 2012-07-19 MED ORDER — DIAZEPAM 5 MG PO TABS: 5.0000 mg | ORAL_TABLET | Freq: Four times a day (QID) | ORAL | Status: DC | PRN

## 2012-07-19 MED ORDER — DSS 100 MG PO CAPS: ORAL_CAPSULE | ORAL | Status: DC

## 2012-07-19 MED ORDER — CELECOXIB 200 MG PO CAPS: ORAL_CAPSULE | ORAL | Status: AC

## 2012-07-19 NOTE — Progress Notes (Signed)
Physical Therapy Progress Note   07/19/12 1300  PT Visit Information  Last PT Received On 07/19/12  Assistance Needed +1  Subjective Data  Subjective "It wasn't hurting when I was a sleep."  Patient Stated Goal To go home today  Precautions  Precautions Knee  Precaution Booklet Issued Yes (comment)  Precaution Comments Provided education on precautions to patient.  Required Braces or Orthoses Knee Immobilizer - Left  Knee Immobilizer - Left On except when in CPM;Discontinue once straight leg raise with < 10 degree lag  Restrictions  Weight Bearing Restrictions Yes  LLE Weight Bearing WBAT  Cognition  Arousal/Alertness Awake/alert  Behavior During Therapy WFL for tasks assessed/performed  Overall Cognitive Status Within Functional Limits for tasks assessed  Bed Mobility  Bed Mobility Supine to Sit;Sitting - Scoot to Edge of Bed  Supine to Sit 4: Min guard  Sitting - Scoot to [place] of Bed 4: Min guard  Details for Bed Mobility Assistance Minguard for safety with cues for technique with HOB flat  Transfers  Transfers Sit to Stand;Stand to Sit  Sit to Stand 4: Min guard;From bed;From chair/3-in-1  Stand to Sit 4: Min guard;To chair/3-in-1  Details for Transfer Assistance Minguard for safety with cues for hand placement  Ambulation/Gait  Ambulation/Gait Assistance 5: Supervision  Ambulation Distance (Feet) 100 Feet  Assistive device Rolling walker  Ambulation/Gait Assistance Details Supervision for safety with cues for step sequence  Gait Pattern Step-to pattern;Shuffle;Antalgic  Gait velocity decreased  Stairs Yes  Stairs Assistance 4: Min assist  Stairs Assistance Details (indicate cue type and reason) (A) to manage RW and cues for caregiver on proper RW placement  Stair Management Technique Backwards;With walker  Number of Stairs 3  Wheelchair Mobility  Wheelchair Mobility No  Total Joint Exercises  Ankle Circles/Pumps AROM;Both;10 reps;Seated  Quad Sets  AAROM;Strengthening;Both;10 reps  Heel Slides AAROM;5 reps;Seated  PT - End of Session  Equipment Utilized During Treatment Left knee immobilizer  Activity Tolerance Patient tolerated treatment well  Patient left in chair;with call bell/phone within reach;with family/visitor present  Nurse Communication Mobility status  PT - Assessment/Plan  Comments on Treatment Session Pt able to complete stair negotiation with caregiver.  Pt safe to d/c home with parents to assist pts.   PT Plan Discharge plan remains appropriate;Frequency remains appropriate  PT Frequency 7X/week  Follow Up Recommendations Home health PT;Supervision/Assistance - 24 hour  PT equipment None recommended by PT  Acute Rehab PT Goals  PT Goal Formulation With patient  Time For Goal Achievement 07/25/12  Potential to Achieve Goals Good  Pt will go Supine/Side to Sit with supervision;with HOB 0 degrees  PT Goal: Supine/Side to Sit - Progress Progressing toward goal  Pt will go Sit to Supine/Side with supervision;with HOB 0 degrees  PT Goal: Sit to Supine/Side - Progress Progressing toward goal  Pt will go Sit to Stand with supervision;with upper extremity assist  PT Goal: Sit to Stand - Progress Progressing toward goal  Pt will Ambulate 51 - 150 feet;with supervision;with rolling walker  PT Goal: Ambulate - Progress Progressing toward goal  Pt will Go Up / Down Stairs 3-5 stairs;with min assist;with least restrictive assistive device  PT Goal: Up/Down Stairs - Progress Met  Pt will Perform Home Exercise Program with supervision, verbal cues required/provided  PT Goal: Perform Home Exercise Program - Progress Progressing toward goal  PT General Charges  $$ ACUTE PT VISIT 1 Procedure  PT Treatments  $Gait Training 8-22 mins  $Therapeutic Activity 8-22  mins   5/10 left knee pain  [place], [place] DPT [phone]

## 2012-07-19 NOTE — Progress Notes (Signed)
Reviewed discharge instructions with patient and family. All questions answered. Prescriptions given.  Patient discharged by RN.

## 2012-07-19 NOTE — Evaluation (Signed)
Occupational Therapy Evaluation Patient Details Name: Melissa Blake MRN: [id number] DOB: 04/30/1965 Today's Date: 07/19/2012 Time: 4098-1191 OT Time Calculation (min): 34 min  OT Assessment / Plan / Recommendation Clinical Impression    Pt is a 47 y.o. Female s/p Lt TKA. Pt overall requiring Min A for ADLs (LB and shower transfer). Will benefit from acute OT to maximize independence prior to return home. Pt will have assistance available 24/7 at home upon d/c.    OT Assessment  Patient needs continued OT Services    Follow Up Recommendations  No OT follow up;Supervision/Assistance - 24 hour    Barriers to Discharge      Equipment Recommendations  3 in 1 bedside comode    Recommendations for Other Services    Frequency  Min 2X/week    Precautions / Restrictions Precautions Precautions: Knee Precaution Booklet Issued: No Precaution Comments: Provided education on precautions to patient. Required Braces or Orthoses: Knee Immobilizer - Left Knee Immobilizer - Left: On except when in CPM;Discontinue once straight leg raise with < 10 degree lag Restrictions Weight Bearing Restrictions: Yes LLE Weight Bearing: Weight bearing as tolerated   Pertinent Vitals/Pain 10/10 pain in knee due to pt in foam with knee extension when entering room; premedicated.     ADL  Eating/Feeding: Independent Where Assessed - Eating/Feeding: Chair Grooming: Set up Where Assessed - Grooming: Unsupported sitting Upper Body Bathing: Set up Where Assessed - Upper Body Bathing: Unsupported sitting Lower Body Bathing: Minimal assistance Where Assessed - Lower Body Bathing: Supported sit to stand Upper Body Dressing: Set up Where Assessed - Upper Body Dressing: Unsupported sitting Lower Body Dressing: Minimal assistance Where Assessed - Lower Body Dressing: Supported sit to stand Toilet Transfer: Min Pension scheme manager Method: Sit to Barista: Raised toilet seat with arms (or  3-in-1 over toilet) Tub/Shower Transfer: Simulated;Minimal assistance Tub/Shower Transfer Method: Science writer: Walk in shower;Other (comment) (3 in 1) Equipment Used: Gait belt;Knee Immobilizer;Rolling walker ADL Comments: Pt practiced simulated shower transfer and pt required minimal assistance to help move walker at appropriate time and for walker safety. Pt able to don/doff sock while sitting in chair, but reported she needed help this morning when pulling shorts/underwear all the way up.     OT Diagnosis: Acute pain  OT Problem List: Decreased range of motion;Decreased activity tolerance;Impaired balance (sitting and/or standing);Decreased knowledge of precautions;Pain;Decreased strength;Decreased knowledge of use of DME or AE OT Treatment Interventions: Self-care/ADL training;DME and/or AE instruction;Therapeutic activities;Patient/family education;Balance training   OT Goals Acute Rehab OT Goals OT Goal Formulation: With patient Time For Goal Achievement: 07/26/12 Potential to Achieve Goals: Good ADL Goals Pt Will Perform Lower Body Bathing: with modified independence;Sit to stand from chair ADL Goal: Lower Body Bathing - Progress: Goal set today Pt Will Perform Lower Body Dressing: with modified independence;Sit to stand from bed;Sit to stand from chair ADL Goal: Lower Body Dressing - Progress: Goal set today Pt Will Transfer to Toilet: with modified independence;Ambulation;with DME ADL Goal: Toilet Transfer - Progress: Goal set today Pt Will Perform Toileting - Clothing Manipulation: with modified independence;Standing ADL Goal: Toileting - Clothing Manipulation - Progress: Goal set today Pt Will Perform Toileting - Hygiene: with modified independence;Sit to stand from 3-in-1/toilet;Sitting on 3-in-1 or toilet ADL Goal: Toileting - Hygiene - Progress: Goal set today Pt Will Perform Tub/Shower Transfer: Shower transfer;with modified  independence;Ambulation;with DME ADL Goal: Tub/Shower Transfer - Progress: Goal set today  Visit Information  Last OT Received On: 07/19/12  Assistance Needed: +1 PT/OT Co-Evaluation/Treatment: Yes    Subjective Data      Prior Functioning     Home Living Lives With: Daughter Available Help at Discharge: Family;Available 24 hours/day (mother staying with pt) Type of Home: House Home Access: Stairs to enter [employer] of Steps: 3 Entrance Stairs-Rails: None Home Layout: One level Bathroom Shower/Tub: Health visitor: Standard Bathroom Accessibility: Yes How Accessible: Accessible via walker Home Adaptive Equipment: Bedside commode/3-in-1;Walker - rolling Prior Function Level of Independence: Independent Able to Take Stairs?: Yes Driving: Yes Vocation: Full time employment Comments: Midwife Communication Communication: No difficulties         Vision/Perception     Cognition  Cognition Arousal/Alertness: Awake/alert Behavior During Therapy: WFL for tasks assessed/performed Overall Cognitive Status: Within Functional Limits for tasks assessed    Extremity/Trunk Assessment Right Upper Extremity Assessment RUE ROM/Strength/Tone: [facility] for tasks assessed Left Upper Extremity Assessment LUE ROM/Strength/Tone: WFL for tasks assessed     Mobility Bed Mobility Bed Mobility: Not assessed Details for Bed Mobility Assistance: Pt sitting in recliner when OT entered room Transfers Transfers: Sit to Stand;Stand to Sit Sit to Stand: 4: Min guard;With upper extremity assist;From chair/3-in-1 Stand to Sit: 4: Min guard;With upper extremity assist;To chair/3-in-1 Details for Transfer Assistance: Minguard for safety with cues for hand placement        Balance     End of Session OT - End of Session Equipment Utilized During Treatment: Gait belt;Left knee immobilizer Activity Tolerance: Patient tolerated treatment well Patient left:  in chair;with call bell/phone within reach;with family/visitor present  GO     Earlie Raveling OTR/L [phone] 07/19/2012, 11:24 AM

## 2012-07-19 NOTE — Progress Notes (Signed)
Physical Therapy Treatment Patient Details Name: Melissa Blake MRN: [id number] DOB: 13-Jun-1965 Today's Date: 07/19/2012 Time: 4098-1191 PT Time Calculation (min): 40 min  PT Assessment / Plan / Recommendation Comments on Treatment Session  Pt very willing to work with therapy and able to ambulate and complete stair negotiation.  [name] very concerned about pt going home this soon.  [name] not present when stairs performed and entered room at end of session.  Therefore will plan to perform stair negotiation prior to d/c for further reassurance, practice and education.    Follow Up Recommendations  Home health PT;Supervision/Assistance - 24 hour     Equipment Recommendations  None recommended by PT    Frequency 7X/week   Plan Discharge plan remains appropriate;Frequency remains appropriate    Precautions / Restrictions Precautions Precautions: Knee Precaution Booklet Issued: Yes (comment) Precaution Comments: Provided education on precautions to patient. Required Braces or Orthoses: Knee Immobilizer - Left Knee Immobilizer - Left: On except when in CPM;Discontinue once straight leg raise with < 10 degree lag Restrictions Weight Bearing Restrictions: Yes LLE Weight Bearing: Weight bearing as tolerated   Pertinent Vitals/Pain 10/10 knee pain due to pt in foam with knee extension when entering room; premedicated    Mobility  Bed Mobility Bed Mobility: Not assessed (Pt sitting recliner when entering room) Transfers Transfers: Sit to Stand;Stand to Sit Sit to Stand: 4: Min guard;From chair/3-in-1 Stand to Sit: 4: Min guard;To chair/3-in-1 Details for Transfer Assistance: Minguard for safety with cues for hand placement Ambulation/Gait Ambulation/Gait Assistance: 4: Min guard Ambulation Distance (Feet): 100 Feet Assistive device: Rolling walker Ambulation/Gait Assistance Details: Minguard for safety with cues for step sequence Gait Pattern: Step-to  pattern;Shuffle;Antalgic Gait velocity: decreased Stairs: Yes Stairs Assistance: 4: Min assist Stairs Assistance Details (indicate cue type and reason): (A) to stabilize RW and maintain balance throughout  Stair Management Technique: With walker;Forwards;Step to pattern Number of Stairs: 3 Wheelchair Mobility Wheelchair Mobility: No    Exercises Total Joint Exercises Heel Slides: AAROM;5 reps;Seated Goniometric ROM: 5-60 degrees of knee flexion   PT Diagnosis:    PT Problem List:   PT Treatment Interventions:     PT Goals Acute Rehab PT Goals PT Goal Formulation: With patient Time For Goal Achievement: 07/25/12 Potential to Achieve Goals: Good Pt will go Sit to Stand: with supervision;with upper extremity assist PT Goal: Sit to Stand - Progress: Progressing toward goal Pt will Ambulate: 51 - 150 feet;with supervision;with rolling walker PT Goal: Ambulate - Progress: Progressing toward goal Pt will Go Up / Down Stairs: 3-5 stairs;with min assist;with least restrictive assistive device PT Goal: Up/Down Stairs - Progress: Met  Visit Information  Last PT Received On: 07/19/12 Assistance Needed: +1    Subjective Data  Subjective: "My parents are really nervous about taking me home.  There probably out there talking someone to get me into rehab." Patient Stated Goal: To go home with parents   Cognition  Cognition Arousal/Alertness: Awake/alert Behavior During Therapy: WFL for tasks assessed/performed Overall Cognitive Status: Within Functional Limits for tasks assessed    Balance     End of Session PT - End of Session Equipment Utilized During Treatment: Left knee immobilizer Activity Tolerance: Patient tolerated treatment well Patient left: in chair;with call bell/phone within reach;with family/visitor present Nurse Communication: Mobility status   GP     Melissa Blake 07/19/2012, 11:04 AM Melissa Blake, PT DPT [phone]

## 2012-07-19 NOTE — Discharge Summary (Signed)
Patient ID: Melissa Blake MRN: [id number] DOB/AGE: [birth year] 47 y.o.  Admit date: 07/18/2012 Discharge date: 07/19/2012  Admission Diagnoses:  Principal Problem:   Left knee DJD Active Problems:   MDD (major depressive disorder), recurrent episode, mild   Anxiety disorder   Migraine, unspecified, without mention of intractable migraine without mention of status migrainosus   Discharge Diagnoses:  Same  Past Medical History  Diagnosis Date  . Meniscus tear 04/2011  . Migraines   . Degenerative arthritis of left knee   . Anxiety     Surgeries: Procedure(s): TOTAL KNEE ARTHROPLASTY on 07/18/2012   Consultants:    Discharged Condition: Improved  Hospital Course: CASSARA NIDA is an 47 y.o. female who was admitted 07/18/2012 for operative treatment ofLeft knee DJD. Patient has severe unremitting pain that affects sleep, daily activities, and work/hobbies. After pre-op clearance the patient was taken to the operating room on 07/18/2012 and underwent  Procedure(s): TOTAL KNEE ARTHROPLASTY.    Patient was given perioperative antibiotics: Anti-infectives   Start     Dose/Rate Route Frequency Ordered Stop   07/18/12 1700  ceFAZolin (ANCEF) IVPB 2 g/50 mL premix     2 g 100 mL/hr over 30 Minutes Intravenous Every 6 hours 07/18/12 1548 07/18/12 2342   07/18/12 0907  cefUROXime (ZINACEF) injection  Status:  Discontinued       As needed 07/18/12 0910 07/18/12 1115   07/18/12 0600  ceFAZolin (ANCEF) IVPB 2 g/50 mL premix     2 g 100 mL/hr over 30 Minutes Intravenous On call to O.R. 07/17/12 1323 07/18/12 4098       Patient was given sequential compression devices, early ambulation, and chemoprophylaxis to prevent DVT.  Patient benefited maximally from hospital stay and there were no complications.    Recent vital signs: Patient Vitals for the past 24 hrs:  BP Temp Pulse Resp SpO2  07/19/12 0540 116/68 mmHg 99.4 F (37.4 C) 88 16 98 %  07/19/12 0400 - - - 20 96 %  07/19/12  0341 120/65 mmHg 99.1 F (37.3 C) 82 18 98 %  07/19/12 0052 - - - 18 97 %  07/18/12 2126 130/67 mmHg 99.5 F (37.5 C) 90 18 99 %  07/18/12 1600 - - - 14 98 %  07/18/12 1550 126/81 mmHg 98.2 F (36.8 C) 89 - 99 %  07/18/12 1515 130/82 mmHg 98.1 F (36.7 C) 81 10 100 %  07/18/12 1500 - - 88 10 99 %  07/18/12 1445 128/79 mmHg - 82 10 98 %  07/18/12 1430 - - 97 14 100 %  07/18/12 1415 - - 87 10 97 %  07/18/12 1400 - - 83 11 97 %  07/18/12 1345 124/73 mmHg - 84 12 97 %  07/18/12 1330 - - 80 10 98 %  07/18/12 1315 - - 83 9 99 %  07/18/12 1300 - - 82 12 97 %  07/18/12 1245 125/82 mmHg 97.8 F (36.6 C) 75 12 98 %  07/18/12 1230 125/82 mmHg - 84 11 96 %  07/18/12 1215 130/85 mmHg - 82 10 92 %  07/18/12 1200 129/75 mmHg - 91 18 99 %  07/18/12 1145 137/104 mmHg - 99 12 98 %  07/18/12 1130 - - 102 20 99 %  07/18/12 1124 147/83 mmHg - 102 16 98 %  07/18/12 1122 157/131 mmHg 97.6 F (36.4 C) 103 16 95 %     Recent laboratory studies:  Recent Labs  07/18/12 1623 07/19/12 0503  WBC  11.2* 9.2  HGB 12.0 11.1*  HCT 34.5* 32.2*  PLT 184 205  NA  --  138  K  --  4.1  CL  --  108  CO2  --  21  BUN  --  7  CREATININE 0.66 0.72  GLUCOSE  --  159*  CALCIUM  --  8.9     Discharge Medications:     Medication List    STOP taking these medications       HYDROcodone-acetaminophen 7.5-750 MG per tablet  Commonly known as:  VICODIN ES     nabumetone 750 MG tablet  Commonly known as:  RELAFEN      TAKE these medications       acetaminophen 500 MG tablet  Commonly known as:  TYLENOL  Take 1,000 mg by mouth every 6 (six) hours as needed for pain.     bisacodyl 5 MG EC tablet  Commonly known as:  DULCOLAX  Take 2 tablets (10 mg total) by mouth daily at 8 pm.     busPIRone 10 MG tablet  Commonly known as:  BUSPAR  Take 10 mg by mouth 3 (three) times daily.     celecoxib 200 MG capsule  Commonly known as:  CELEBREX  1 tablet daily with breakfast for pain and swelling      diazepam 5 MG tablet  Commonly known as:  VALIUM  Take 1 tablet (5 mg total) by mouth every 6 (six) hours as needed for anxiety (and muscle spasm).     DSS 100 MG Caps  1 tab 2 times a day while on narcotics.  STOOL SOFTENER     enoxaparin 30 MG/0.3ML injection  Commonly known as:  LOVENOX  Inject 0.3 mLs (30 mg total) into the skin every 12 (twelve) hours.     oxyCODONE 5 MG immediate release tablet  Commonly known as:  Oxy IR/ROXICODONE  1-2 tablets every 4-6 hrs as needed for pain     OxyCODONE 40 mg T12a  Commonly known as:  OXYCONTIN  Take 1 tablet (40 mg total) by mouth every 12 (twelve) hours.     sertraline 50 MG tablet  Commonly known as:  ZOLOFT  Take 50 mg by mouth daily.     topiramate 25 MG tablet  Commonly known as:  TOPAMAX  Take 25 mg by mouth 2 (two) times daily.        Diagnostic Studies: Dg Chest 2 View  07/11/2012   *RADIOLOGY REPORT*  Clinical Data: Preop for left knee arthroplasty  CHEST - 2 VIEW  Comparison: None.  Findings: Cardiomediastinal silhouette is unremarkable.  No acute infiltrate or pleural effusion.  No pulmonary edema.  Minimal thoracic dextroscoliosis.  IMPRESSION: No active disease.   Original Report Authenticated By: Natasha Mead, M.D.    Disposition: 20-Expired      Discharge Orders   Future Orders Complete By Expires     CPM  As directed     Comments:      Continuous passive motion machine (CPM):      Use the CPM from 0 to 90 for 6 hours per day.       You may break it up into 2 or 3 sessions per day.      Use CPM for 2 weeks or until you are told to stop.    Call MD / Call 911  As directed     Comments:      If you experience chest pain or shortness  of breath, CALL 911 and be transported to the hospital emergency room.  If you develope a fever above 101 F, pus (white drainage) or increased drainage or redness at the wound, or calf pain, call your surgeon's office.    Change dressing  As directed     Comments:      Change the  dressing daily with sterile 4 x 4 inch gauze dressing and apply TED hose.  You may clean the incision with alcohol prior to redressing.    Constipation Prevention  As directed     Comments:      Drink plenty of fluids.  Prune juice may be helpful.  You may use a stool softener, such as Colace (over the counter) 100 mg twice a day.  Use MiraLax (over the counter) for constipation as needed.    Diet - low sodium heart healthy  As directed     Discharge instructions  As directed     Comments:      Total Knee Replacement Care After Refer to this sheet in the next few weeks. These discharge instructions provide you with general information on caring for yourself after you leave the hospital. Your caregiver may also give you specific instructions. Your treatment has been planned according to the most current medical practices available, but unavoidable complications sometimes occur. If you have any problems or questions after discharge, please call your caregiver. Regaining a near full range of motion of your knee within the first 3 to 6 weeks after surgery is critical. HOME CARE INSTRUCTIONS  You may resume a normal diet and activities as directed.  Perform exercises as directed.  Place grey foam block, curved side up under heel at all times except when in CPM or when walking.  DO NOT modify, tear, cut, or change in any way the grey foam block. You will receive physical therapy daily  Take showers instead of baths until informed otherwise.  Change bandages (dressings)daily Do not take over-the-counter or prescription medicines for pain, discomfort, or fever. Eat a well-balanced diet.  Avoid lifting or driving until you are instructed otherwise.  Make an appointment to see your caregiver for stitches (suture) or staple removal as directed.  If you have been sent home with a continuous passive motion machine (CPM machine), 0-90 degrees 6 hrs a day   2 hrs a shift SEEK MEDICAL CARE IF: You have  swelling of your calf or leg.  You develop shortness of breath or chest pain.  You have redness, swelling, or increasing pain in the wound.  There is pus or any unusual drainage coming from the surgical site.  You notice a bad smell coming from the surgical site or dressing.  The surgical site breaks open after sutures or staples have been removed.  There is persistent bleeding from the suture or staple line.  You are getting worse or are not improving.  You have any other questions or concerns.  SEEK IMMEDIATE MEDICAL CARE IF:  You have a fever.  You develop a rash.  You have difficulty breathing.  You develop any reaction or side effects to medicines given.  Your knee motion is decreasing rather than improving.  MAKE SURE YOU:  Understand these instructions.  Will watch your condition.  Will get help right away if you are not doing well or get worse.    Do not put a pillow under the knee. Place it under the heel.  As directed     Comments:  Place gray foam block, curve side up under heel at all times except when in CPM or when walking.  DO NOT modify, tear, cut, or change in any way the gray foam block.    Increase activity slowly as tolerated  As directed     TED hose  As directed     Comments:      Use stockings (TED hose) for 2 weeks on both leg(s).  You may remove them at night for sleeping.       Follow-up Information   Follow up with Nilda Simmer, MD On 08/01/2012. (appt time 2 pm)    Contact information:   [address] [place] [address] [phone]        Signed: Pascal Lux 07/19/2012, 9:55 AM

## 2012-07-19 NOTE — Care Management Note (Signed)
CARE MANAGEMENT NOTE 07/19/2012  Patient:  LILIA, LETTERMAN   Account Number:  [id number]  Date Initiated:  07/19/2012  Documentation initiated by:  Vance Peper  Subjective/Objective Assessment:   47 yr old female s/p left total knee arthroplasty.     Action/Plan:   Patient preoperatively setup with [facility], no changes. Patient has rolling walker, 3in1 and CPM. Has family support at discharge.   Anticipated DC Date:  07/20/2012   Anticipated DC Plan:  HOME W HOME HEALTH SERVICES      DC Planning Services  CM consult      [facility] Choice  HOME HEALTH   Choice offered to / List presented to:          [facility] arranged  [facility]      [facility] agency  [facility]   Status of service:  Completed, signed off Medicare Important Message given?   (If response is "NO", the following Medicare IM given date fields will be blank) Date Medicare IM given:   Date Additional Medicare IM given:    Discharge Disposition:  HOME W HOME HEALTH SERVICES  Per UR Regulation:    If discussed at Long Length of Stay Meetings, dates discussed:    Comments:

## 2012-07-20 LAB — HLA-B27 ANTIGEN: DNA Result:: NOT DETECTED

## 2012-08-19 ENCOUNTER — Ambulatory Visit (HOSPITAL_COMMUNITY): Payer: Self-pay | Admitting: Psychiatry

## 2012-08-23 ENCOUNTER — Encounter (HOSPITAL_COMMUNITY): Payer: Self-pay | Admitting: Psychiatry

## 2012-08-23 ENCOUNTER — Ambulatory Visit (INDEPENDENT_AMBULATORY_CARE_PROVIDER_SITE_OTHER): Payer: BC Managed Care – PPO | Admitting: Psychiatry

## 2012-08-23 VITALS — BP 125/81 | HR 78 | Ht 66.0 in | Wt 225.0 lb

## 2012-08-23 DIAGNOSIS — F411 Generalized anxiety disorder: Secondary | ICD-10-CM

## 2012-08-23 DIAGNOSIS — F330 Major depressive disorder, recurrent, mild: Secondary | ICD-10-CM

## 2012-08-23 MED ORDER — SERTRALINE HCL 50 MG PO TABS: ORAL_TABLET | ORAL | Status: DC

## 2012-08-23 MED ORDER — DULOXETINE HCL 30 MG PO CPEP: 30.0000 mg | ORAL_CAPSULE | Freq: Every day | ORAL | Status: DC
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
## 2012-08-24 ENCOUNTER — Telehealth (HOSPITAL_COMMUNITY): Payer: Self-pay

## 2012-08-24 NOTE — Telephone Encounter (Signed)
CYMBALTA WAS COVERED AT $40 AND WHEN CAN SHE START TAKING THAT. DOES SHE WAIT TILL SHE IS OFF THE THREE DAYS OF BUSPAR OR WAIT TILL OFF ZOLOFT. PLEASE INFORM

## 2012-08-25 NOTE — Telephone Encounter (Signed)
Called patient. Left message, she can start cymbalta 30 mg now, and take zoloft at full dose for 14 days, then decrease to 25 mg daily. I informed the patient she may wait to start cymbalta after stopping buspirone if she feels more comfortable doing that.

## 2012-09-27 ENCOUNTER — Encounter (INDEPENDENT_AMBULATORY_CARE_PROVIDER_SITE_OTHER): Payer: BC Managed Care – PPO

## 2012-09-27 DIAGNOSIS — M79609 Pain in unspecified limb: Secondary | ICD-10-CM

## 2012-09-27 DIAGNOSIS — M7989 Other specified soft tissue disorders: Secondary | ICD-10-CM

## 2012-10-18 ENCOUNTER — Ambulatory Visit (INDEPENDENT_AMBULATORY_CARE_PROVIDER_SITE_OTHER): Payer: BC Managed Care – PPO | Admitting: Psychiatry

## 2012-10-18 ENCOUNTER — Encounter (HOSPITAL_COMMUNITY): Payer: Self-pay | Admitting: Psychiatry

## 2012-10-18 VITALS — BP 118/74 | HR 86 | Ht 66.0 in | Wt 220.0 lb

## 2012-10-18 DIAGNOSIS — F330 Major depressive disorder, recurrent, mild: Secondary | ICD-10-CM

## 2012-10-18 DIAGNOSIS — F411 Generalized anxiety disorder: Secondary | ICD-10-CM

## 2012-10-18 MED ORDER — DULOXETINE HCL 60 MG PO CPEP: 60.0000 mg | ORAL_CAPSULE | Freq: Every day | ORAL | Status: DC
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
## 2012-10-20 ENCOUNTER — Other Ambulatory Visit (HOSPITAL_COMMUNITY): Payer: Self-pay | Admitting: Psychiatry

## 2012-11-16 ENCOUNTER — Encounter (HOSPITAL_COMMUNITY): Payer: Self-pay | Admitting: Psychiatry

## 2012-11-16 ENCOUNTER — Ambulatory Visit (INDEPENDENT_AMBULATORY_CARE_PROVIDER_SITE_OTHER): Payer: BC Managed Care – PPO | Admitting: Psychiatry

## 2012-11-16 VITALS — BP 117/80 | HR 103 | Ht 66.0 in | Wt 221.0 lb

## 2012-11-16 DIAGNOSIS — F330 Major depressive disorder, recurrent, mild: Secondary | ICD-10-CM

## 2012-11-16 DIAGNOSIS — F411 Generalized anxiety disorder: Secondary | ICD-10-CM

## 2012-11-16 MED ORDER — DULOXETINE HCL 30 MG PO CPEP: 90.0000 mg | ORAL_CAPSULE | Freq: Every day | ORAL | Status: DC
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
## 2012-12-01 ENCOUNTER — Telehealth (HOSPITAL_COMMUNITY): Payer: Self-pay

## 2012-12-01 NOTE — Telephone Encounter (Signed)
Second attempt to call patient. Left Message stating I would call her back tomorrow.

## 2012-12-01 NOTE — Telephone Encounter (Signed)
First attempt call patient. Will call again at the end of the day.

## 2012-12-02 NOTE — Telephone Encounter (Signed)
Fourth Attempt to call patient. Left message stating patient could have a trial decrease of Cymbalta to 60 mg and report results on Monday.

## 2012-12-02 NOTE — Telephone Encounter (Signed)
Third attempt to reach patient. Will try again later today.

## 2012-12-02 NOTE — Addendum Note (Signed)
Addended by: Larena Sox on: 12/02/2012 07:17 PM   Modules accepted: Orders, Medications

## 2012-12-16 ENCOUNTER — Ambulatory Visit (INDEPENDENT_AMBULATORY_CARE_PROVIDER_SITE_OTHER): Payer: BC Managed Care – PPO | Admitting: Psychiatry

## 2012-12-16 ENCOUNTER — Encounter (HOSPITAL_COMMUNITY): Payer: Self-pay | Admitting: Psychiatry

## 2012-12-16 VITALS — BP 117/78 | HR 92 | Ht 66.0 in | Wt 224.0 lb

## 2012-12-16 DIAGNOSIS — F411 Generalized anxiety disorder: Secondary | ICD-10-CM

## 2012-12-16 DIAGNOSIS — F330 Major depressive disorder, recurrent, mild: Secondary | ICD-10-CM

## 2012-12-16 DIAGNOSIS — F332 Major depressive disorder, recurrent severe without psychotic features: Secondary | ICD-10-CM | POA: Insufficient documentation
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
## 2013-01-26 ENCOUNTER — Other Ambulatory Visit (HOSPITAL_COMMUNITY): Payer: Self-pay | Admitting: Psychiatry

## 2013-02-01 ENCOUNTER — Encounter (HOSPITAL_COMMUNITY): Payer: Self-pay | Admitting: Psychiatry

## 2013-02-01 ENCOUNTER — Ambulatory Visit (INDEPENDENT_AMBULATORY_CARE_PROVIDER_SITE_OTHER): Payer: BC Managed Care – PPO | Admitting: Psychiatry

## 2013-02-01 VITALS — BP 120/79 | HR 85 | Ht 66.0 in | Wt 232.0 lb

## 2013-02-01 DIAGNOSIS — F330 Major depressive disorder, recurrent, mild: Secondary | ICD-10-CM

## 2013-02-01 DIAGNOSIS — F419 Anxiety disorder, unspecified: Secondary | ICD-10-CM

## 2013-02-01 DIAGNOSIS — F411 Generalized anxiety disorder: Secondary | ICD-10-CM

## 2013-02-01 MED ORDER — DULOXETINE HCL 30 MG PO CPEP: 60.0000 mg | ORAL_CAPSULE | Freq: Every day | ORAL | Status: DC
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
## 2013-02-07 ENCOUNTER — Telehealth (HOSPITAL_COMMUNITY): Payer: Self-pay

## 2013-02-09 MED ORDER — DULOXETINE HCL 30 MG PO CPEP: 90.0000 mg | ORAL_CAPSULE | Freq: Every day | ORAL | Status: DC

## 2013-02-09 NOTE — Telephone Encounter (Addendum)
Called patient

## 2013-02-09 NOTE — Telephone Encounter (Signed)
She is taking 90 mg daily of duloxetine.

## 2013-02-10 MED ORDER — DULOXETINE HCL 30 MG PO CPEP: ORAL_CAPSULE | ORAL | Status: DC

## 2013-02-10 NOTE — Telephone Encounter (Signed)
Called patient. Informed patient that her prescription.  She reports she is taking 30 mg QAM and 60 mg QHS.

## 2013-02-10 NOTE — Addendum Note (Signed)
Addended by: Larena Sox on: 02/10/2013 06:13 PM   Modules accepted: Orders

## 2013-03-17 ENCOUNTER — Ambulatory Visit (INDEPENDENT_AMBULATORY_CARE_PROVIDER_SITE_OTHER): Payer: BC Managed Care – PPO | Admitting: Psychiatry

## 2013-03-17 ENCOUNTER — Encounter (HOSPITAL_COMMUNITY): Payer: Self-pay | Admitting: Psychiatry

## 2013-03-17 VITALS — BP 118/86 | HR 85 | Wt 240.0 lb

## 2013-03-17 DIAGNOSIS — F419 Anxiety disorder, unspecified: Secondary | ICD-10-CM

## 2013-03-17 DIAGNOSIS — F330 Major depressive disorder, recurrent, mild: Secondary | ICD-10-CM

## 2013-03-17 DIAGNOSIS — F411 Generalized anxiety disorder: Secondary | ICD-10-CM

## 2013-03-17 MED ORDER — DULOXETINE HCL 30 MG PO CPEP: ORAL_CAPSULE | ORAL | Status: DC
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
## 2013-04-06 ENCOUNTER — Other Ambulatory Visit (HOSPITAL_COMMUNITY): Payer: Self-pay | Admitting: Psychiatry

## 2013-04-07 NOTE — Telephone Encounter (Signed)
Refilled cymbalta.

## 2013-05-19 ENCOUNTER — Encounter (HOSPITAL_COMMUNITY): Payer: Self-pay | Admitting: Psychiatry

## 2013-05-19 ENCOUNTER — Ambulatory Visit (INDEPENDENT_AMBULATORY_CARE_PROVIDER_SITE_OTHER): Payer: BC Managed Care – PPO | Admitting: Psychiatry

## 2013-05-19 ENCOUNTER — Encounter (INDEPENDENT_AMBULATORY_CARE_PROVIDER_SITE_OTHER): Payer: Self-pay

## 2013-05-19 VITALS — BP 125/88 | HR 89 | Wt 249.0 lb

## 2013-05-19 DIAGNOSIS — F330 Major depressive disorder, recurrent, mild: Secondary | ICD-10-CM

## 2013-05-19 DIAGNOSIS — F411 Generalized anxiety disorder: Secondary | ICD-10-CM

## 2013-05-19 DIAGNOSIS — F419 Anxiety disorder, unspecified: Secondary | ICD-10-CM

## 2013-05-19 MED ORDER — DULOXETINE HCL 30 MG PO CPEP: 90.0000 mg | ORAL_CAPSULE | Freq: Every day | ORAL | Status: AC
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
## 2013-06-10 ENCOUNTER — Other Ambulatory Visit (HOSPITAL_COMMUNITY): Payer: Self-pay | Admitting: Psychiatry

## 2013-06-14 NOTE — Telephone Encounter (Signed)
Refilled Cymbalta 

## 2013-08-25 ENCOUNTER — Ambulatory Visit (HOSPITAL_COMMUNITY): Payer: Self-pay | Admitting: Psychiatry

## 2013-08-30 ENCOUNTER — Other Ambulatory Visit: Payer: Self-pay | Admitting: Obstetrics and Gynecology

## 2014-05-07 IMAGING — CR DG CHEST 2V
2 series · 2 of 2 positions shown · non-contrast
Comparison: None.

CLINICAL DATA: Preop for left knee arthroplasty

CHEST - 2 VIEW

[view not recorded (1 of 2)]
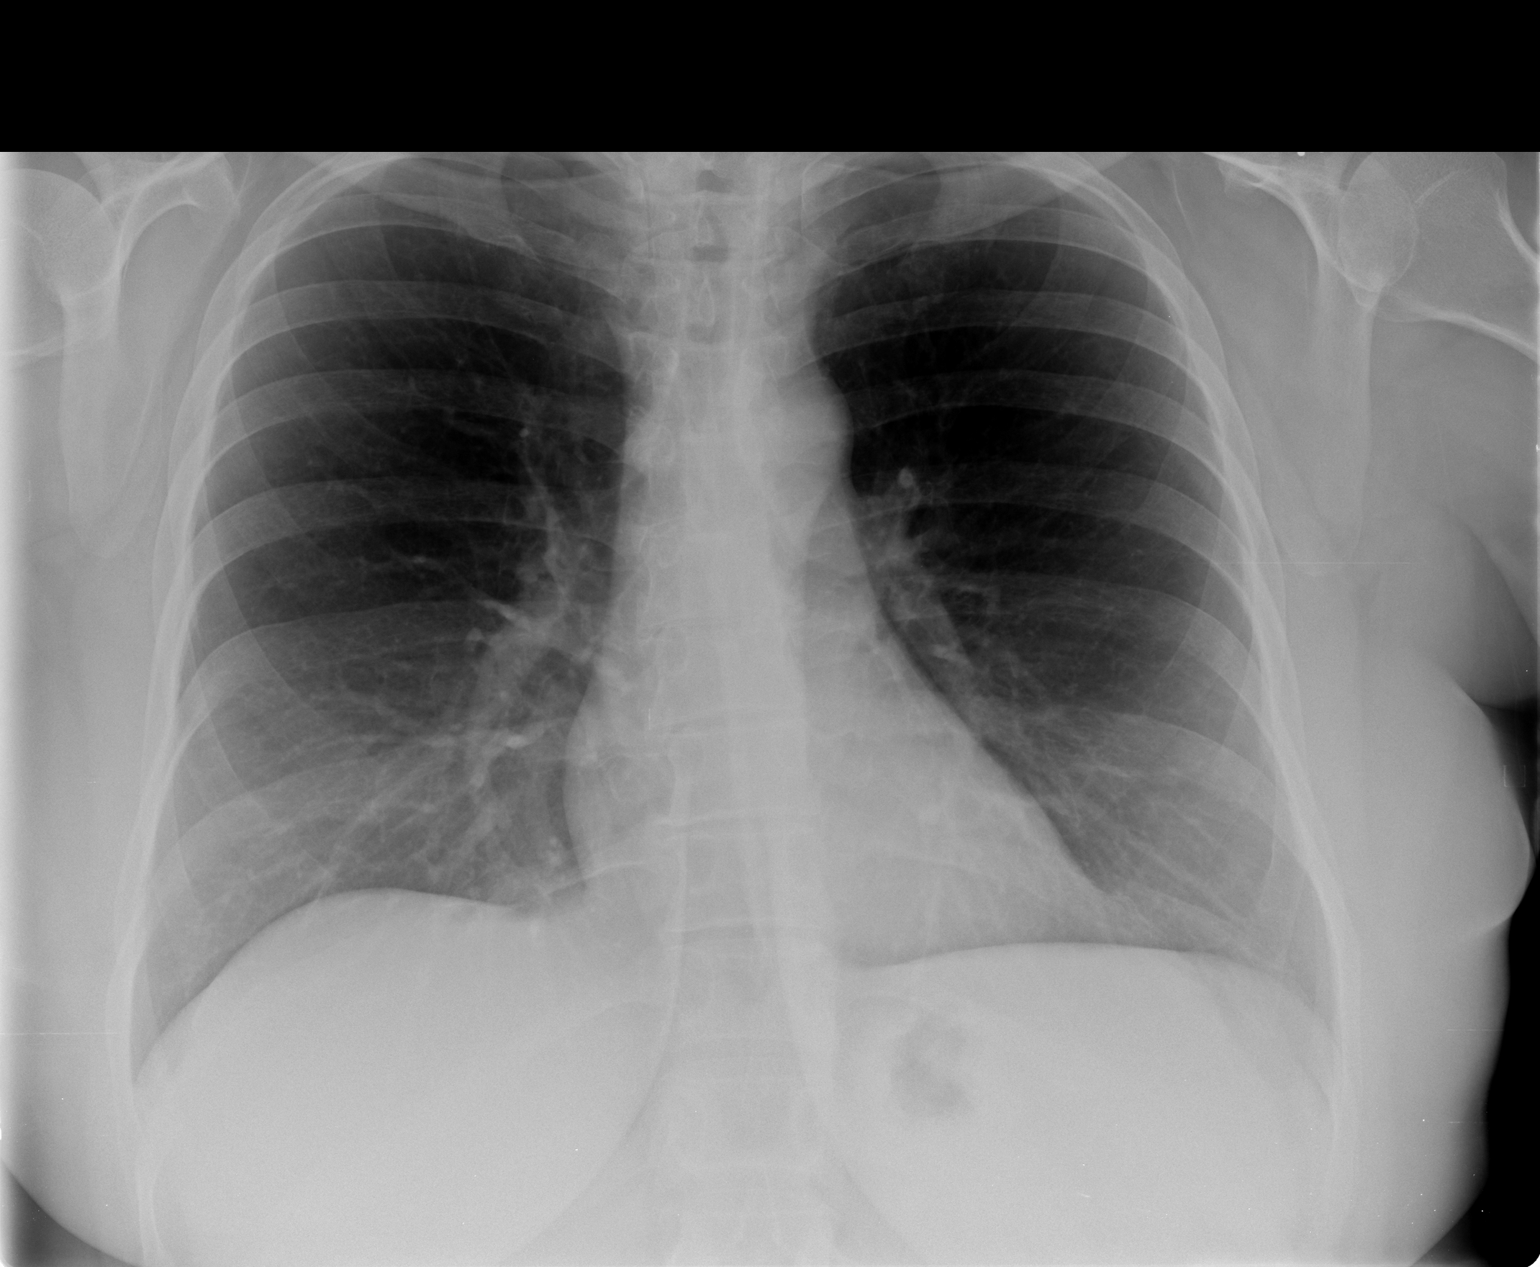

[view not recorded (2 of 2)]
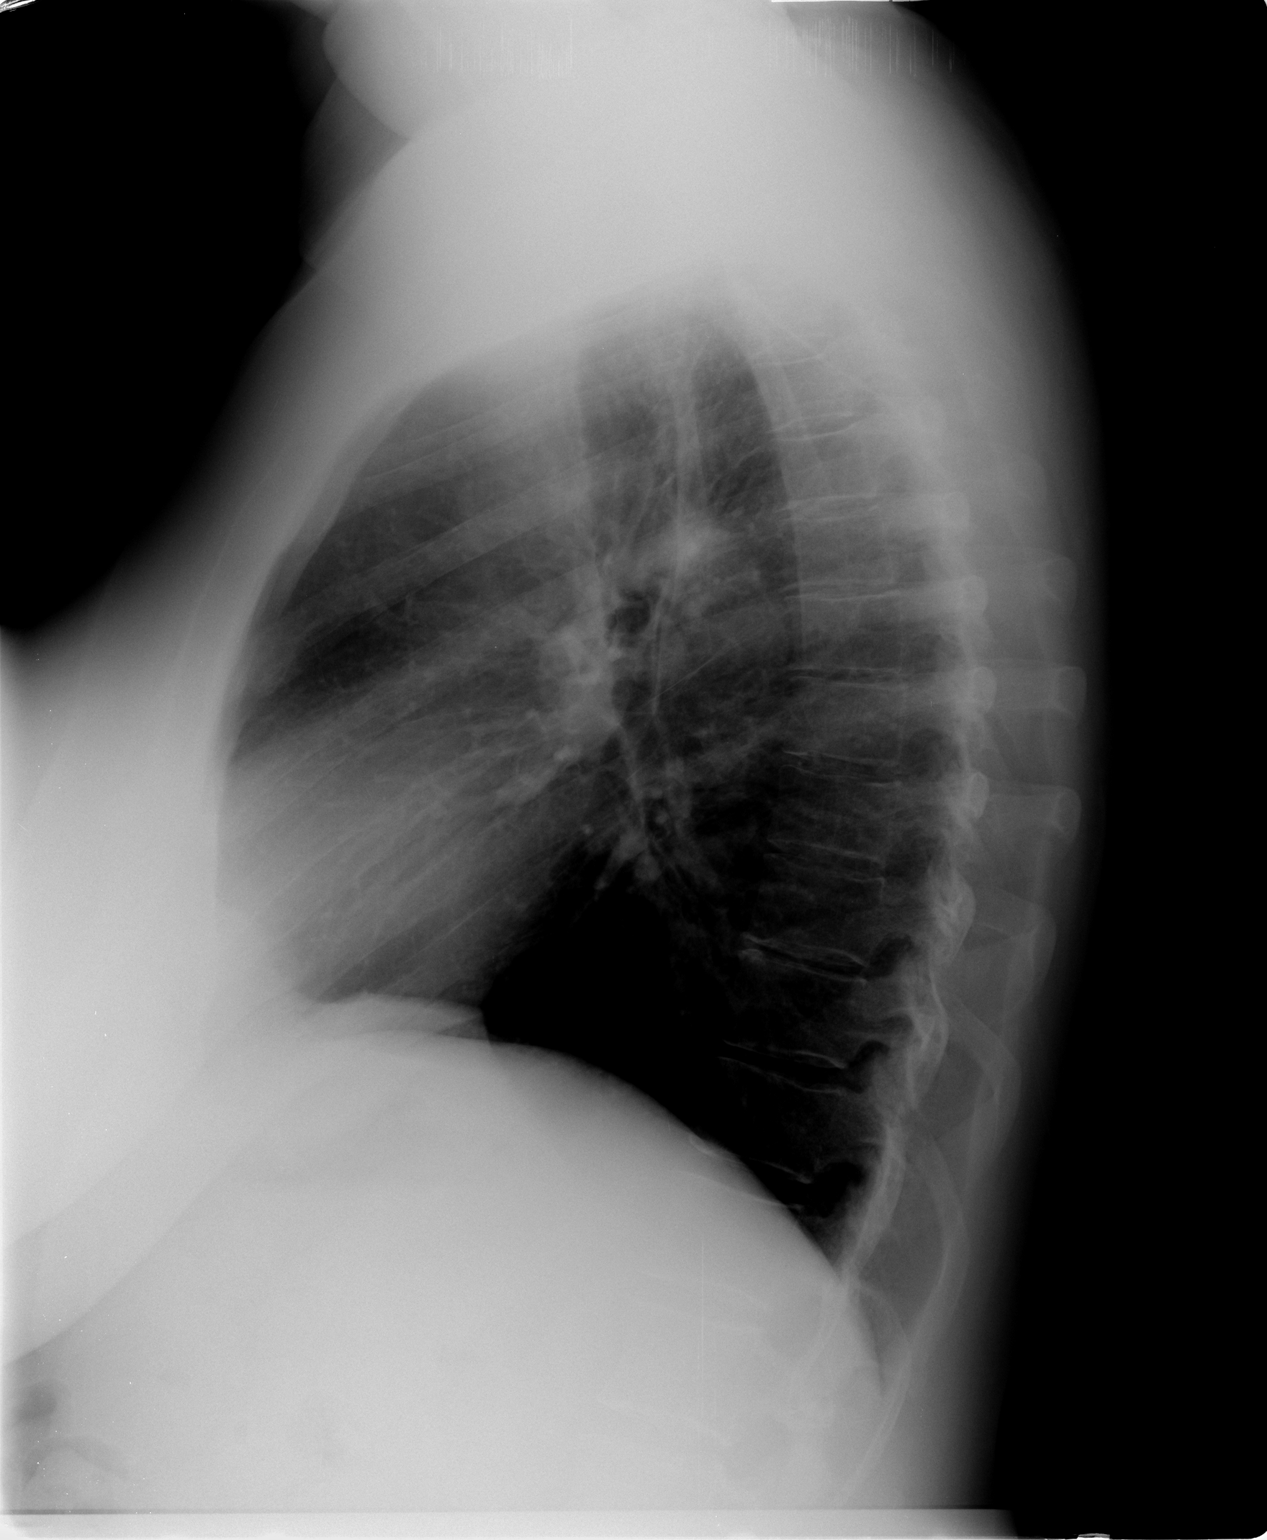

[2 of 2 positions shown; findings below may reference images not displayed]

FINDINGS: Cardiomediastinal silhouette is unremarkable.  No acute
infiltrate or pleural effusion.  No pulmonary edema.  Minimal
thoracic dextroscoliosis.
IMPRESSION: No active disease.

## 2014-09-18 ENCOUNTER — Other Ambulatory Visit: Payer: Self-pay | Admitting: Obstetrics and Gynecology

## 2014-09-19 LAB — CYTOLOGY - PAP

## 2014-09-21 ENCOUNTER — Other Ambulatory Visit: Payer: Self-pay | Admitting: Obstetrics and Gynecology

## 2014-09-21 DIAGNOSIS — R928 Other abnormal and inconclusive findings on diagnostic imaging of breast: Secondary | ICD-10-CM

## 2014-09-27 ENCOUNTER — Ambulatory Visit
Admission: RE | Admit: 2014-09-27 | Discharge: 2014-09-27 | Disposition: A | Discharge status: Home / Self Care | Payer: BC Managed Care – PPO | Source: Ambulatory Visit | Attending: Obstetrics and Gynecology | Admitting: Obstetrics and Gynecology

## 2014-09-27 DIAGNOSIS — R928 Other abnormal and inconclusive findings on diagnostic imaging of breast: Secondary | ICD-10-CM

## 2015-03-29 ENCOUNTER — Other Ambulatory Visit: Payer: Self-pay | Admitting: Obstetrics and Gynecology

## 2015-03-29 DIAGNOSIS — N6452 Nipple discharge: Secondary | ICD-10-CM

## 2015-03-29 DIAGNOSIS — N6320 Unspecified lump in the left breast, unspecified quadrant: Secondary | ICD-10-CM

## 2015-04-03 ENCOUNTER — Other Ambulatory Visit: Payer: Self-pay | Admitting: Obstetrics and Gynecology

## 2015-04-03 ENCOUNTER — Ambulatory Visit
Admission: RE | Admit: 2015-04-03 | Discharge: 2015-04-03 | Disposition: A | Discharge status: Home / Self Care | Payer: BC Managed Care – PPO | Source: Ambulatory Visit | Attending: Obstetrics and Gynecology | Admitting: Obstetrics and Gynecology

## 2015-04-03 DIAGNOSIS — N6320 Unspecified lump in the left breast, unspecified quadrant: Secondary | ICD-10-CM

## 2015-04-03 DIAGNOSIS — N6452 Nipple discharge: Secondary | ICD-10-CM

## 2015-04-16 ENCOUNTER — Ambulatory Visit
Admission: RE | Admit: 2015-04-16 | Discharge: 2015-04-16 | Disposition: A | Discharge status: Home / Self Care | Payer: BC Managed Care – PPO | Source: Ambulatory Visit | Attending: Obstetrics and Gynecology | Admitting: Obstetrics and Gynecology

## 2015-04-16 DIAGNOSIS — N6452 Nipple discharge: Secondary | ICD-10-CM

## 2015-04-16 DIAGNOSIS — N6320 Unspecified lump in the left breast, unspecified quadrant: Secondary | ICD-10-CM

## 2015-05-15 ENCOUNTER — Ambulatory Visit (INDEPENDENT_AMBULATORY_CARE_PROVIDER_SITE_OTHER): Payer: BC Managed Care – PPO | Admitting: Podiatry

## 2015-05-15 ENCOUNTER — Encounter: Payer: Self-pay | Admitting: Podiatry

## 2015-05-15 VITALS — BP 150/103 | HR 101 | Resp 18

## 2015-05-15 DIAGNOSIS — B351 Tinea unguium: Secondary | ICD-10-CM | POA: Diagnosis not present

## 2015-05-15 DIAGNOSIS — L603 Nail dystrophy: Secondary | ICD-10-CM | POA: Diagnosis not present

## 2015-05-15 NOTE — Patient Instructions (Signed)

## 2015-05-15 NOTE — Progress Notes (Signed)
   Subjective:    Patient ID: Melissa Blake, female    DOB: 11/22/1965, 50 y.o.   MRN: [id number]  HPI  50 year old female presents the office today with concerns of discoloration and thickening of both of her toenails and big toes. She states they do not hurt all that she is concerned about the way of work and he continue to get worse. She is tried multiple over-the-counter creams without any relief. As his been ongoing for greater than 1 year. No other complaints.  Review of Systems  All other systems reviewed and are negative.      Objective:   Physical Exam General: AAO x3, NAD  Dermatological: Lateral hallux nails appear to be dystrophic, discolored, atrophic mostly distally. There is no tenderness in the nails. There is mild incurvation of both the medial and lateral nail border without any swelling, redness or pain on the ingrown portions. The remaining nails appear to be somewhat hypertrophic and discolored but mild. No open lesions.  Vascular: Dorsalis Pedis artery and Posterior Tibial artery pedal pulses are 2/4 bilateral with immedate capillary fill time. Pedal hair growth present. No varicosities and no lower extremity edema present bilateral. There is no pain with calf compression, swelling, warmth, erythema.   Neruologic: Grossly intact via light touch bilateral. Vibratory intact via tuning fork bilateral. Protective threshold with Semmes Wienstein monofilament intact to all pedal sites bilateral. Patellar and Achilles deep tendon reflexes 2+ bilateral. No Babinski or clonus noted bilateral.   Musculoskeletal: No gross boney pedal deformities bilateral. No pain, crepitus, or limitation noted with foot and ankle range of motion bilateral. Muscular strength 5/5 in all groups tested bilateral.  Gait: Unassisted, Nonantalgic.      Assessment & Plan:   50 year old female with bilateral hallux onychodystrophy, possible onychomycosis -Treatment options discussed including all  alternatives, risks, and complications -Etiology of symptoms were discussed -Nails were debrided and sent for biopsy/culture. Discussed treatment options including medication, laser. We will await results of the culture before proceeding with treatment. -Follow-up after nail culture or sooner if any problems arise. In the meantime, encouraged to call the office with any questions, concerns, change in symptoms.   Ovid CurdMatthew Wagoner, DPM

## 2015-05-17 NOTE — Addendum Note (Signed)
Addended by: Hadley PenOX, Ranya Fiddler R on: 05/17/2015 03:01 PM   Modules accepted: Orders

## 2015-05-28 ENCOUNTER — Other Ambulatory Visit: Payer: Self-pay | Admitting: General Surgery

## 2015-06-03 ENCOUNTER — Telehealth: Payer: Self-pay | Admitting: *Deleted

## 2015-06-03 NOTE — Telephone Encounter (Addendum)
Dr. Ardelle AntonWagoner reviewed 05/16/2015 fungal culture as negative for fungus, may try [id number]. Left message to call for results. 06/04/2015-Pt returned call for results.  Informed pt of Dr. Gabriel RungWagoner's recommendations and she said [facility] would [facility].  I told her I will send with Dr. Ardelle AntonWagoner or Misty StanleyLisa.

## 2015-06-07 ENCOUNTER — Encounter: Payer: Self-pay | Admitting: Podiatry

## 2015-06-12 ENCOUNTER — Ambulatory Visit: Payer: BC Managed Care – PPO | Admitting: Podiatry
